# Patient Record
Sex: Female | Born: 2000 | Race: Black or African American | Hispanic: No | Smoking: Never smoker
Health system: Southern US, Community
[De-identification: ages and names within clinical notes are randomized; demographics above are authoritative.]

## PROBLEM LIST (undated history)

## (undated) NOTE — *Deleted (*Deleted)
PROGRESS NOTE    Pamela Bryant  BJS:[id number] DOB: [birth year] DOA: 09/14/2020 PCP: Pcp, No    Chief Complaint  Patient presents with  . Loss of Consciousness  . Laceration    Brief Narrative:  HPI per Dr Ponciano Ort is a 33 y.o. female with no previous past medical history who is coming to the emergency department after passing out at home.  Per patient, after she had waking up earlier, she took a shower around 1100 and felt a little lightheaded just before coming out of the shower.  When she went to her room, she felt like her vision was blacking out along with a fainting sensation, passed out and do not remember what else happened after the.  She hit herself on her chin and fractured 2 oh her premolars on her right side.  She initially had a headache, but this has since then disappeared.  She denies any other prodromal symptoms like dyspnea, chest pain/pressure, palpitations, diaphoresis, nausea or emesis.  She mentioned that she last ate (Chipotle) the previous night around 2200.  She does not have a history of previous hypoglycemic or syncopal episodes.  She denies fever, chills, rhinorrhea, sore throat, wheezing or hemoptysis.  No abdominal pain, diarrhea, constipation, melena or hematochezia.  She denies dysuria, frequency or hematuria.  She denies polyuria, polydipsia, polyphagia or blurred vision.  ED Course: Initial vital signs were temperature 98.8 F, pulse 92, respiration 15, blood pressure 115/85 mmHg O2 sat 100% on room air.   Her urinalysis was hazy with moderate hemoglobinuria, ketonuria of 20 and proteinuria of 38 mg/dL.  Nitrites negative.  Leukocyte esterase is large with more than 50 WBC with 11-20 RBC per hpf.  However, bacteria are absent.  CBC and BMP were normal.  Serum quantitative hCG was negative.  Several measurements of capillary blood glucose have been hypoglycemic.   Assessment & Plan:   Principal Problem:   Syncopal episode Active Problems:    Hypoglycemia   Dental injury   Facial laceration   APC (atrial premature contractions)   Abnormal urinalysis   Syncope   1 syncope Questionable etiology. Differential includes cardiogenic etiology versus vasovagal versus orthostasis. Per admitting physician patient noted to have experienced some lightheadedness after getting out of the shower and subsequently syncopal episode. Patient denies any tongue biting or bowel or bladder incontinence. Orthostatics were not checked on admission and were checked after patient had received boluses of IV fluids and was subsequently negative. Patient however noted to have systolic blood pressure of 89 per ED records. Patient with no other prodrome symptoms. Head CT negative. 2D echo done this morning with a EF of 55 to 60%, no wall motion abnormalities, abnormal mitral valve, mild leaflet thickening without prolapse, trivial mitral valve regurgitation.  EKG with no ischemic changes noted. Patient noted to have a CBG as low as 54 however doubt that was the etiology of patient's syncopal episode. Hemoglobin A1c pending. Cardiology consulted and have assessed the patient. Placed on IV fluids of D5 normal saline at 125 cc an hour for the next 12 to 24 hours. Monitor CBGs. Monitor on telemetry. Supportive care. Follow.  2. Hypoglycemia Questionable etiology. Placed on D5 normal saline 125 cc an hour for the next 12 to 24 hours. Follow CBGs every 4 hours. If blood glucose levels remained improved will discontinue D5 normal saline and monitor CBGs. Hemoglobin A1c ordered and pending. Follow.  3. Dental injury Outpatient follow-up with dentist.  4. Facial laceration Continue local wound  care.  5. Abnormal urinalysis Urinalysis with large leukocytes, nitrite negative, greater than 50 WBCs. Urine cultures pending. Placed empirically on IV Rocephin. Supportive care. Follow.   DVT prophylaxis: Lovenox. Code Status: Full Family Communication: Updated patient.  No  family at bedside. Disposition:   Status is: Inpatient    Dispo: The patient is from: Home              Anticipated d/c is to: Home              Anticipated d/c date is: 1 to 2 days.              Patient currently being evaluated for syncopal episode, being monitored on telemetry.  Not stable for discharge.       Consultants:   Cardiology: Dr. Shari Prows 09/15/2020  Procedures:   2D echo 09/15/2020  Plain films of the left shoulder 09/14/2020  CT head 09/14/2020  Antimicrobials:   IV Rocephin 09/15/2020   Subjective: In ED, getting orthostatics done.  Denies chest pain.  No shortness of breath.  Objective: Vitals:   09/15/20 1610 09/15/20 2011 09/16/20 0016 09/16/20 0410  BP: 111/67 118/85 117/86 109/62  Pulse: 94 74 73 (!) 48  Resp:  17 20 16   Temp: 99.4 F (37.4 C) 99 F (37.2 C) 99.1 F (37.3 C) 97.6 F (36.4 C)  TempSrc: Oral Oral Oral Oral  SpO2: 100% 100% 100% 100%  Weight:      Height:        Intake/Output Summary (Last 24 hours) at 09/16/2020 1239 Last data filed at 09/16/2020 1100 Gross per 24 hour  Intake 1075.65 ml  Output -  Net 1075.65 ml   Filed Weights   09/14/20 1242  Weight: 61.4 kg    Examination:  General exam: Appears calm and comfortable  Respiratory system: Clear to auscultation. Respiratory effort normal. Cardiovascular system: Regular rate rhythm with some extrasystoles noted.  No JVD.  No murmurs rubs or gallops.  No lower extremity edema.  Gastrointestinal system: Abdomen is nondistended, soft and nontender. No organomegaly or masses felt. Normal bowel sounds heard. Central nervous system: Alert and oriented. No focal neurological deficits. Extremities: Symmetric 5 x 5 power. Skin: Left chin area with laceration.  Psychiatry: Judgement and insight appear normal. Mood & affect appropriate.     Data Reviewed: I have personally reviewed following labs and imaging studies  CBC: Recent Labs  Lab 09/14/20 1414  WBC  8.1  HGB 12.3  HCT 38.1  MCV 83.9  PLT 235    Basic Metabolic Panel: Recent Labs  Lab 09/14/20 1414 09/15/20 0458 09/16/20 0458  NA 141  --  139  K 3.9  --  3.3*  CL 103  --  107  CO2 27  --  25  GLUCOSE 87  --  108*  BUN 12  --  9  CREATININE 0.70  --  0.75  CALCIUM 9.7  --  8.3*  MG  --  1.8 2.1  PHOS  --  4.1  --     GFR: Estimated Creatinine Clearance: 93.6 mL/min (by C-G formula based on SCr of 0.75 mg/dL).  Liver Function Tests: Recent Labs  Lab 09/15/20 0458  AST 19  ALT 15  ALKPHOS 30*  BILITOT 0.5  PROT 6.6  ALBUMIN 3.7    CBG: Recent Labs  Lab 09/16/20 0006 09/16/20 0408 09/16/20 0747 09/16/20 1135 09/16/20 1204  GLUCAP 84 92 76 61* 65*     Recent Results (from  the past 240 hour(s))  Urine Culture     Status: None (Preliminary result)   Collection Time: 09/14/20  7:36 PM   Specimen: Urine, Clean Catch  Result Value Ref Range Status   Specimen Description   Final    URINE, CLEAN CATCH Performed at [facility], [address] [place], [place] [address]    Special Requests   Final    Normal Performed at [facility], [address], [place], [place] [address]    Culture   Final    CULTURE REINCUBATED FOR BETTER GROWTH Performed at [facility], [address], [place], [place] [address]    Report Status PENDING  Incomplete  Resp Panel by RT PCR (RSV, Flu A&B, Covid) - Nasopharyngeal Swab     Status: None   Collection Time: 09/15/20 10:10 AM   Specimen: Nasopharyngeal Swab  Result Value Ref Range Status   SARS Coronavirus 2 by RT PCR NEGATIVE NEGATIVE Final    Comment: (NOTE) SARS-CoV-2 target nucleic acids are NOT DETECTED.  The SARS-CoV-2 RNA is generally detectable in upper respiratoy specimens during the acute phase of infection. The lowest concentration of SARS-CoV-2 viral copies this assay can detect is 131 copies/mL. A negative result does not preclude SARS-Cov-2 infection and  should not be used as the sole basis for treatment or other patient management decisions. A negative result may occur with  improper specimen collection/handling, submission of specimen other than nasopharyngeal swab, presence of viral mutation(s) within the areas targeted by this assay, and inadequate number of viral copies (<131 copies/mL). A negative result must be combined with clinical observations, patient history, and epidemiological information. The expected result is Negative.  Fact Sheet for Patients:  https://www.moore.com/  Fact Sheet for Healthcare Providers:  https://www.young.biz/  This test is no t yet approved or cleared by the [place] FDA and  has been authorized for detection and/or diagnosis of SARS-CoV-2 by FDA under an Emergency Use Authorization (EUA). This EUA will remain  in effect (meaning this test can be used) for the duration of the COVID-19 declaration under Section 564(b)(1) of the Act, 21 U.S.C. section 360bbb-3(b)(1), unless the authorization is terminated or revoked sooner.     Influenza A by PCR NEGATIVE NEGATIVE Final   Influenza B by PCR NEGATIVE NEGATIVE Final    Comment: (NOTE) The Xpert Xpress SARS-CoV-2/FLU/RSV assay is intended as an aid in  the diagnosis of influenza from Nasopharyngeal swab specimens and  should not be used as a sole basis for treatment. Nasal washings and  aspirates are unacceptable for Xpert Xpress SARS-CoV-2/FLU/RSV  testing.  Fact Sheet for Patients: https://www.moore.com/  Fact Sheet for Healthcare Providers: https://www.young.biz/  This test is not yet approved or cleared by the [place] FDA and  has been authorized for detection and/or diagnosis of SARS-CoV-2 by  FDA under an Emergency Use Authorization (EUA). This EUA will remain  in effect (meaning this test can be used) for the duration of the  Covid-19 declaration  under Section 564(b)(1) of the Act, 21  U.S.C. section 360bbb-3(b)(1), unless the authorization is  terminated or revoked.    Respiratory Syncytial Virus by PCR NEGATIVE NEGATIVE Final    Comment: (NOTE) Fact Sheet for Patients: https://www.moore.com/  Fact Sheet for Healthcare Providers: https://www.young.biz/  This test is not yet approved or cleared by the [place] FDA and  has been authorized for detection and/or diagnosis of SARS-CoV-2 by  FDA under an Emergency Use Authorization (EUA).  This EUA will remain  in effect (meaning this test can be used) for the duration of the  COVID-19 declaration under Section 564(b)(1) of the Act, 21 U.S.C.  section 360bbb-3(b)(1), unless the authorization is terminated or  revoked. Performed at [facility], [address], [place], [place] [address]          Radiology Studies: CT Head Wo Contrast  Result Date: 09/14/2020 CLINICAL DATA:  Syncope EXAM: CT HEAD WITHOUT CONTRAST TECHNIQUE: Contiguous axial images were obtained from the base of the skull through the vertex without intravenous contrast. COMPARISON:  None. FINDINGS: Brain: No evidence of acute infarction, hemorrhage, hydrocephalus, extra-axial collection or mass lesion/mass effect. Vascular: No hyperdense vessel or unexpected calcification. Skull: Normal. Negative for fracture or focal lesion. Sinuses/Orbits: No acute finding. Other: None. IMPRESSION: No acute intracranial pathology. Electronically Signed   By: Lauralyn Primes M.D.   On: 09/14/2020 15:26   DG Shoulder Left  Result Date: 09/14/2020 CLINICAL DATA:  Pain after fall. EXAM: LEFT SHOULDER - 2+ VIEW COMPARISON:  None. FINDINGS: There is no evidence of fracture or dislocation. There is no evidence of arthropathy or other focal bone abnormality. Soft tissues are unremarkable. IMPRESSION: Negative. Electronically Signed   By: Gerome Sam III M.D   On: 09/14/2020  15:15   ECHOCARDIOGRAM COMPLETE  Result Date: 09/15/2020    ECHOCARDIOGRAM REPORT   Patient Name:   Pamela Bryant Date of Exam: 09/15/2020 Medical Rec #:  [id number]     Height:       63.0 in Accession #:    [id number]    Weight:       135.4 lb Date of Birth:  06-05-2001    BSA:          1.638 m Patient Age:    18 years      BP:           97/61 mmHg Patient Gender: F             HR:           64 bpm. Exam Location:  Inpatient Procedure: 2D Echo, Cardiac Doppler and Color Doppler Indications:    R55 Syncope  History:        Patient has no prior history of Echocardiogram examinations.  Sonographer:    Elmarie Shiley Dance Referring Phys: [id number] DAVID MANUEL ORTIZ IMPRESSIONS  1. Left ventricular ejection fraction, by estimation, is 55 to 60%. The left ventricle has normal function. The left ventricle has no regional wall motion abnormalities. Left ventricular diastolic parameters were normal.  2. Right ventricular systolic function is normal. The right ventricular size is normal.  3. The mitral valve is abnormal. Mild leaflet thickening without prolapse. Trivial mitral valve regurgitation.  4. The aortic valve is tricuspid. Aortic valve regurgitation is not visualized. Conclusion(s)/Recommendation(s): Otherwise normal echocardiogram, with minor abnormalities described in the report. FINDINGS  Left Ventricle: Left ventricular ejection fraction, by estimation, is 55 to 60%. The left ventricle has normal function. The left ventricle has no regional wall motion abnormalities. The left ventricular internal cavity size was normal in size. There is  no left ventricular hypertrophy. Left ventricular diastolic parameters were normal. Right Ventricle: The right ventricular size is normal. No increase in right ventricular wall thickness. Right ventricular systolic function is normal. Left Atrium: Left atrial size was normal in size. Right Atrium: Right atrial size was normal in size. Pericardium: There is no evidence of  pericardial effusion. Mitral Valve: The mitral valve is abnormal.  There is mild thickening of the mitral valve leaflet(s). Trivial mitral valve regurgitation. Tricuspid Valve: The tricuspid valve is grossly normal. Tricuspid valve regurgitation is trivial. Aortic Valve: The aortic valve is tricuspid. Aortic valve regurgitation is not visualized. Pulmonic Valve: The pulmonic valve was grossly normal. Pulmonic valve regurgitation is trivial. Aorta: The aortic root and ascending aorta are structurally normal, with no evidence of dilitation. IAS/Shunts: No atrial level shunt detected by color flow Doppler.  LEFT VENTRICLE PLAX 2D LVIDd:         3.60 cm LVIDs:         2.90 cm LV PW:         0.90 cm LV IVS:        0.60 cm LVOT diam:     2.00 cm LV SV:         47 LV SV Index:   29 LVOT Area:     3.14 cm  RIGHT VENTRICLE             IVC RV Basal diam:  2.50 cm     IVC diam: 1.30 cm RV S prime:     10.40 cm/s TAPSE (M-mode): 1.7 cm LEFT ATRIUM             Index       RIGHT ATRIUM           Index LA diam:        2.90 cm 1.77 cm/m  RA Area:     10.40 cm LA Vol (A2C):   58.2 ml 35.53 ml/m RA Volume:   20.20 ml  12.33 ml/m LA Vol (A4C):   33.9 ml 20.69 ml/m LA Biplane Vol: 45.1 ml 27.53 ml/m  AORTIC VALVE LVOT Vmax:   80.10 cm/s LVOT Vmean:  49.000 cm/s LVOT VTI:    0.151 m  AORTA Ao Root diam: 2.80 cm Ao Asc diam:  2.40 cm MITRAL VALVE MV Area (PHT): 3.60 cm    SHUNTS MV Decel Time: 211 msec    Systemic VTI:  0.15 m MV E velocity: 70.30 cm/s  Systemic Diam: 2.00 cm MV A velocity: 37.70 cm/s MV E/A ratio:  1.86 Zoila Shutter MD Electronically signed by Zoila Shutter MD Signature Date/Time: 09/15/2020/1:16:41 PM    Final         Scheduled Meds: . enoxaparin (LOVENOX) injection  30 mg Subcutaneous Q24H  . influenza vac split quadrivalent PF  0.5 mL Intramuscular Tomorrow-1000   Continuous Infusions: . cefTRIAXone (ROCEPHIN)  IV 1 g (09/16/20 1229)     LOS: 1 day    Time spent: 35 minutes    Ramiro Harvest, MD [facility]   To contact the attending provider between 7A-7P or the covering provider during after hours 7P-7A, please log into the web site [email or web address] and access using universal [place] password for that web site. If you do not have the password, please call the hospital operator.  09/16/2020, 12:39 PM

## (undated) NOTE — *Deleted (*Deleted)
Hypoglycemic Event  CBG: 61  Treatment: 8 oz juice/soda  Symptoms: None  Follow-up CBG: Time:*** CBG Result:***  Possible Reasons for Event: Inadequate meal intake and Unknown  Comments/MD notified:***    Pamela Bryant, Pamela Bryant

---

## 2020-09-14 ENCOUNTER — Emergency Department (HOSPITAL_COMMUNITY): Payer: Medicaid Other

## 2020-09-14 ENCOUNTER — Other Ambulatory Visit: Payer: Self-pay

## 2020-09-14 ENCOUNTER — Encounter (HOSPITAL_COMMUNITY): Payer: Self-pay

## 2020-09-14 ENCOUNTER — Inpatient Hospital Stay (HOSPITAL_COMMUNITY)
Admission: EM | Admit: 2020-09-14 | Discharge: 2020-09-16 | DRG: 641 | Disposition: A | Discharge status: Home / Self Care | Payer: Medicaid Other | Attending: Internal Medicine | Admitting: Internal Medicine

## 2020-09-14 DIAGNOSIS — R8290 Unspecified abnormal findings in urine: Secondary | ICD-10-CM | POA: Diagnosis present

## 2020-09-14 DIAGNOSIS — R809 Proteinuria, unspecified: Secondary | ICD-10-CM | POA: Diagnosis present

## 2020-09-14 DIAGNOSIS — S025XXA Fracture of tooth (traumatic), initial encounter for closed fracture: Secondary | ICD-10-CM | POA: Diagnosis present

## 2020-09-14 DIAGNOSIS — S0181XA Laceration without foreign body of other part of head, initial encounter: Secondary | ICD-10-CM

## 2020-09-14 DIAGNOSIS — S0993XA Unspecified injury of face, initial encounter: Secondary | ICD-10-CM | POA: Diagnosis present

## 2020-09-14 DIAGNOSIS — R823 Hemoglobinuria: Secondary | ICD-10-CM | POA: Diagnosis present

## 2020-09-14 DIAGNOSIS — Y92002 Bathroom of unspecified non-institutional (private) residence single-family (private) house as the place of occurrence of the external cause: Secondary | ICD-10-CM

## 2020-09-14 DIAGNOSIS — R55 Syncope and collapse: Principal | ICD-10-CM

## 2020-09-14 DIAGNOSIS — Y93E1 Activity, personal bathing and showering: Secondary | ICD-10-CM

## 2020-09-14 DIAGNOSIS — I491 Atrial premature depolarization: Secondary | ICD-10-CM | POA: Diagnosis present

## 2020-09-14 DIAGNOSIS — N390 Urinary tract infection, site not specified: Secondary | ICD-10-CM

## 2020-09-14 DIAGNOSIS — E876 Hypokalemia: Secondary | ICD-10-CM | POA: Diagnosis not present

## 2020-09-14 DIAGNOSIS — W1839XA Other fall on same level, initial encounter: Secondary | ICD-10-CM | POA: Diagnosis present

## 2020-09-14 DIAGNOSIS — R824 Acetonuria: Secondary | ICD-10-CM | POA: Diagnosis present

## 2020-09-14 DIAGNOSIS — Z20822 Contact with and (suspected) exposure to covid-19: Secondary | ICD-10-CM | POA: Diagnosis present

## 2020-09-14 DIAGNOSIS — E162 Hypoglycemia, unspecified: Principal | ICD-10-CM | POA: Diagnosis present

## 2020-09-14 LAB — BASIC METABOLIC PANEL
Anion gap: 11 (ref 5–15)
BUN: 12 mg/dL (ref 6–20)
CO2: 27 mmol/L (ref 22–32)
Calcium: 9.7 mg/dL (ref 8.9–10.3)
Chloride: 103 mmol/L (ref 98–111)
Creatinine, Ser: 0.7 mg/dL (ref 0.44–1.00)
GFR, Estimated: >60 mL/min (ref >60)
Glucose, Bld: 87 mg/dL (ref 70–99)
Potassium: 3.9 mmol/L (ref 3.5–5.1)
Sodium: 141 mmol/L (ref 135–145)

## 2020-09-14 LAB — CBG MONITORING, ED
Glucose-Capillary: 54 mg/dL — ABNORMAL LOW (ref 70–99)
Glucose-Capillary: 64 mg/dL — ABNORMAL LOW (ref 70–99)
Glucose-Capillary: 57 mg/dL — ABNORMAL LOW (ref 70–99)
Glucose-Capillary: 61 mg/dL — ABNORMAL LOW (ref 70–99)
Glucose-Capillary: 205 mg/dL — ABNORMAL HIGH (ref 70–99)

## 2020-09-14 LAB — URINALYSIS, ROUTINE W REFLEX MICROSCOPIC
Bacteria, UA: NONE SEEN
Bilirubin Urine: NEGATIVE
Glucose, UA: NEGATIVE mg/dL
Ketones, ur: 20 mg/dL — AB
Nitrite: NEGATIVE
Protein, ur: 30 mg/dL — AB
Specific Gravity, Urine: 1.018 (ref 1.005–1.030)
WBC, UA: >50 WBC/hpf — ABNORMAL HIGH (ref 0–5)
pH: 6 (ref 5.0–8.0)

## 2020-09-14 LAB — CBC
HCT: 38.1 % (ref 36.0–46.0)
Hemoglobin: 12.3 g/dL (ref 12.0–15.0)
MCH: 27.1 pg (ref 26.0–34.0)
MCHC: 32.3 g/dL (ref 30.0–36.0)
MCV: 83.9 fL (ref 80.0–100.0)
Platelets: 235 10*3/uL (ref 150–400)
RBC: 4.54 MIL/uL (ref 3.87–5.11)
RDW: 13.9 % (ref 11.5–15.5)
WBC: 8.1 10*3/uL (ref 4.0–10.5)
nRBC: 0 % (ref 0.0–0.2)

## 2020-09-14 LAB — I-STAT BETA HCG BLOOD, ED (MC, WL, AP ONLY): I-stat hCG, quantitative: <5 m[IU]/mL (ref <5)

## 2020-09-14 MED ORDER — KCL IN DEXTROSE-NACL 20-5-0.9 MEQ/L-%-% IV SOLN
Freq: Once | INTRAVENOUS | Status: DC
  Filled 2020-09-14: qty 1000

## 2020-09-14 MED ORDER — KCL IN DEXTROSE-NACL 20-5-0.9 MEQ/L-%-% IV SOLN
INTRAVENOUS | Status: DC
  Filled 2020-09-14 (×2): qty 1000

## 2020-09-14 MED ORDER — OXYCODONE HCL 5 MG PO TABS: 5.0000 mg | ORAL_TABLET | ORAL | Status: DC | PRN

## 2020-09-14 MED ORDER — DEXTROSE 10 % IV SOLN: INTRAVENOUS | Status: DC

## 2020-09-14 MED ORDER — KETOROLAC TROMETHAMINE 30 MG/ML IJ SOLN: 30.0000 mg | Freq: Four times a day (QID) | INTRAMUSCULAR | Status: AC | PRN

## 2020-09-14 MED ORDER — ACETAMINOPHEN 325 MG PO TABS: 650.0000 mg | ORAL_TABLET | Freq: Four times a day (QID) | ORAL | Status: DC | PRN

## 2020-09-14 MED ORDER — DEXTROSE 50 % IV SOLN
25.0000 g | Freq: Once | INTRAVENOUS | Status: AC
  Administered 2020-09-14: 25 g via INTRAVENOUS
  Filled 2020-09-14: qty 50

## 2020-09-14 MED ORDER — ONDANSETRON HCL 4 MG/2ML IJ SOLN: 4.0000 mg | Freq: Four times a day (QID) | INTRAMUSCULAR | Status: DC | PRN

## 2020-09-14 MED ORDER — ACETAMINOPHEN 650 MG RE SUPP: 650.0000 mg | Freq: Four times a day (QID) | RECTAL | Status: DC | PRN

## 2020-09-14 MED ORDER — KCL IN DEXTROSE-NACL 20-5-0.9 MEQ/L-%-% IV SOLN
INTRAVENOUS | Status: AC
  Filled 2020-09-14: qty 1000

## 2020-09-14 MED ORDER — CEPHALEXIN 500 MG PO CAPS: 500.0000 mg | ORAL_CAPSULE | Freq: Four times a day (QID) | ORAL | 0 refills | Status: DC

## 2020-09-14 MED ORDER — DEXTROSE 50 % IV SOLN: 12.5000 g | INTRAVENOUS | Status: DC | PRN

## 2020-09-14 MED ORDER — ONDANSETRON HCL 4 MG PO TABS: 4.0000 mg | ORAL_TABLET | Freq: Four times a day (QID) | ORAL | Status: DC | PRN

## 2020-09-14 NOTE — Discharge Instructions (Signed)
Return if any problems.

## 2020-09-14 NOTE — H&P (Signed)
History and Physical    Pamela Bryant KDT:[id number] DOB: 30-Apr-2001 DOA: 09/14/2020  PCP: Pcp, No   Patient coming from: Home.   I have personally briefly reviewed patient's old medical records in [facility]  Chief Complaint:  "Passed out".  HPI: Pamela Bryant is a 19 y.o. female with no previous past medical history who is coming to the emergency department after passing out at home.  Per patient, after she had waking up earlier, she took a shower around 1100 and felt a little lightheaded just before coming out of the shower.  When she went to her room, she felt like her vision was blacking out along with a fainting sensation, passed out and do not remember what else happened after the.  She hit herself on her chin and fractured 2 oh her premolars on her right side.  She initially had a headache, but this has since then disappeared.  She denies any other prodromal symptoms like dyspnea, chest pain/pressure, palpitations, diaphoresis, nausea or emesis.  She mentioned that she last ate (Chipotle) the previous night around 2200.  She does not have a history of previous hypoglycemic or syncopal episodes.  She denies fever, chills, rhinorrhea, sore throat, wheezing or hemoptysis.  No abdominal pain, diarrhea, constipation, melena or hematochezia.  She denies dysuria, frequency or hematuria.  She denies polyuria, polydipsia, polyphagia or blurred vision.  ED Course: Initial vital signs were temperature 98.8 F, pulse 92, respiration 15, blood pressure 115/85 mmHg O2 sat 100% on room air.   Her urinalysis was hazy with moderate hemoglobinuria, ketonuria of 20 and proteinuria of 38 mg/dL.  Nitrites negative.  Leukocyte esterase is large with more than 50 WBC with 11-20 RBC per hpf.  However, bacteria are absent.  CBC and BMP were normal.  Serum quantitative hCG was negative.  Several measurements of capillary blood glucose have been hypoglycemic.  Review of Systems: As per HPI otherwise all other  systems reviewed and are negative.  History reviewed. No pertinent past medical history.  History reviewed. No pertinent surgical history.  Social History  reports that she has never smoked. She has never used smokeless tobacco. She reports that she does not drink alcohol and does not use drugs.  No Known Allergies  Family History  Problem Relation Age of Onset  . Healthy Mother   . Healthy Father   No known medical history on her close relatives and extended family members.   Prior to Admission medications   Medication Sig Start Date End Date Taking? Authorizing Provider  cephALEXin (KEFLEX) 500 MG capsule Take 1 capsule (500 mg total) by mouth 4 (four) times daily for 7 days. 09/14/20 09/21/20  Elson Areas, PA-C   Physical Exam: Vitals:   09/14/20 1930 09/14/20 2000 09/14/20 2030 09/14/20 2100  BP: 106/71 115/74 105/68 121/89  Pulse: 87 79 88 73  Resp:    20  Temp:      TempSrc:      SpO2: 100% 99% 100% 99%  Weight:      Height:       Constitutional: NAD, calm, comfortable Eyes: PERRL, lids and conjunctivae mildly injected. ENMT: Mucous membranes are moist.  Fractured upper and lower premolars.  Posterior pharynx clear of any exudate or lesions. Neck: normal, supple, no masses, no thyromegaly Respiratory: clear to auscultation bilaterally, no wheezing, no crackles. Normal respiratory effort. No accessory muscle use.  Cardiovascular: Frequent extrasystoles, no murmurs / rubs / gallops. No extremity edema. 2+ pedal pulses. No carotid  bruits.  Abdomen: no tenderness, no masses palpated. No hepatosplenomegaly. Bowel sounds positive.  Musculoskeletal: no clubbing / cyanosis. Good ROM, no contractures. Normal muscle tone.  Skin: Left chin area laceration. Neurologic: CN 2-12 grossly intact. Sensation intact, DTR normal. Strength 5/5 in all 4.  Psychiatric: Normal judgment and insight. Alert and oriented x 3. Normal mood.   Labs on Admission: I have personally reviewed  following labs and imaging studies  CBC: Recent Labs  Lab 09/14/20 1414  WBC 8.1  HGB 12.3  HCT 38.1  MCV 83.9  PLT 235    Basic Metabolic Panel: Recent Labs  Lab 09/14/20 1414  NA 141  K 3.9  CL 103  CO2 27  GLUCOSE 87  BUN 12  CREATININE 0.70  CALCIUM 9.7   GFR: Estimated Creatinine Clearance: 93.6 mL/min (by C-G formula based on SCr of 0.7 mg/dL).  Liver Function Tests: No results for input(s): AST, ALT, ALKPHOS, BILITOT, PROT, ALBUMIN in the last 168 hours.  Urine analysis:    Component Value Date/Time   COLORURINE YELLOW 09/14/2020 1735   APPEARANCEUR HAZY (A) 09/14/2020 1735   LABSPEC 1.018 09/14/2020 1735   PHURINE 6.0 09/14/2020 1735   GLUCOSEU NEGATIVE 09/14/2020 1735   HGBUR MODERATE (A) 09/14/2020 1735   BILIRUBINUR NEGATIVE 09/14/2020 1735   KETONESUR 20 (A) 09/14/2020 1735   PROTEINUR 30 (A) 09/14/2020 1735   NITRITE NEGATIVE 09/14/2020 1735   LEUKOCYTESUR LARGE (A) 09/14/2020 1735   Radiological Exams on Admission: CT Head Wo Contrast  Result Date: 09/14/2020 CLINICAL DATA:  Syncope EXAM: CT HEAD WITHOUT CONTRAST TECHNIQUE: Contiguous axial images were obtained from the base of the skull through the vertex without intravenous contrast. COMPARISON:  None. FINDINGS: Brain: No evidence of acute infarction, hemorrhage, hydrocephalus, extra-axial collection or mass lesion/mass effect. Vascular: No hyperdense vessel or unexpected calcification. Skull: Normal. Negative for fracture or focal lesion. Sinuses/Orbits: No acute finding. Other: None. IMPRESSION: No acute intracranial pathology. Electronically Signed   By: Lauralyn Primes M.D.   On: 09/14/2020 15:26   DG Shoulder Left  Result Date: 09/14/2020 CLINICAL DATA:  Pain after fall. EXAM: LEFT SHOULDER - 2+ VIEW COMPARISON:  None. FINDINGS: There is no evidence of fracture or dislocation. There is no evidence of arthropathy or other focal bone abnormality. Soft tissues are unremarkable. IMPRESSION:  Negative. Electronically Signed   By: Gerome Sam III M.D   On: 09/14/2020 15:15   EKG: Independently reviewed.  Vent. rate 95 BPM PR interval 126 ms QRS duration 90 ms QT/QTc 332/417 ms P-R-T axes 90 69 39 Normal sinus rhythm with sinus arrhythmia Nonspecific T wave abnormality  Assessment/Plan Principal Problem:   Syncopal episode Observation/telemetry. Continue IV fluids. Continue hyperglycemia treatment. Recheck EKG in a.m. Check echocardiogram in a.m.  Active Problems:   Hypoglycemia Encourage oral intake. Continue dextrose infusion. Monitor glucose every 4 hours.    Dental injury Follow-up with orthodontist.    Facial laceration Continue local care.    Asymptomatic Abnormal urinalysis No need for treatment. Patient advised to increase hydration.    DVT prophylaxis: SCDs.  Code Status:   Full code. Family Communication: Disposition Plan:   Patient is from:  Home.  Anticipated DC to:  Home.  Anticipated DC date:  09/15/2020.  Anticipated DC barriers: Clinical status and echocardiogram.  Consults called: Admission status:  Observation/telemetry.  Severity of Illness:  Bobette Mo MD [facility]  How to contact the [facility] Attending or Consulting provider 7A - 7P or covering provider during  after hours 7P -7A, for this patient?   1. Check the care team in [facility] and look for a) attending/consulting [facility] provider listed and b) the [facility] team listed 2. Log into [email or web address] and use [place] universal password to access. If you do not have the password, please contact the hospital operator. 3. Locate the [facility] provider you are looking for under [facility] and page to a number that you can be directly reached. 4. If you still have difficulty reaching the provider, please page the [facility] (Director on Call) for the Hospitalists listed on amion for assistance.  09/14/2020, 9:13 PM

## 2020-09-14 NOTE — ED Notes (Signed)
Ginger ale given per her request. She remains in no distress.

## 2020-09-14 NOTE — ED Triage Notes (Signed)
Patient states she was taking a shower less than an hour ago and passed out. Patient states she hit her chin on the floor an has a laceration. Bleeding controlled. Patient denies any blood thinners.

## 2020-09-14 NOTE — ED Notes (Signed)
Pt resting at this time. Unable to obtain urine at this time. Pt friend at bedside stated "she just dozed off." Pt friend stated she would "let me know when the pt wakes" to obtain urine.

## 2020-09-14 NOTE — ED Provider Notes (Signed)
[facility]-EMERGENCY DEPT Provider Note   CSN: [id number] Arrival date & time: 09/14/20  1217     History Chief Complaint  Patient presents with  . Loss of Consciousness  . Laceration    Pamela Bryant is a 19 y.o. female.  The history is provided by the patient. No language interpreter was used.  Loss of Consciousness Episode history:  Single Most recent episode:  Today Duration:  1 minute Timing:  Constant Progression:  Resolved Witnessed: no   Relieved by:  Nothing Worsened by:  Nothing Ineffective treatments:  None tried Associated symptoms: no nausea, no palpitations and no seizures   Risk factors: no seizures   Laceration Pt reports she passed out and hit her mouth.  Pt complains of a cut to her chin, broken teeth and pain in her shoulder.  Pt denies any medical problems.  Pt was feeling well before episode.  Pt had just taken a shower.       History reviewed. No pertinent past medical history.  There are no problems to display for this patient.   History reviewed. No pertinent surgical history.   OB History   No obstetric history on file.     Family History  Problem Relation Age of Onset  . Healthy Mother   . Healthy Father     Social History   Tobacco Use  . Smoking status: Never Smoker  . Smokeless tobacco: Never Used  Vaping Use  . Vaping Use: Never used  Substance Use Topics  . Alcohol use: Never  . Drug use: Never    Home Medications Prior to Admission medications   Medication Sig Start Date End Date Taking? Authorizing Provider  cephALEXin (KEFLEX) 500 MG capsule Take 1 capsule (500 mg total) by mouth 4 (four) times daily for 7 days. 09/14/20 09/21/20  Elson Areas, PA-C    Allergies    Patient has no known allergies.  Review of Systems   Review of Systems  Cardiovascular: Positive for syncope. Negative for palpitations.  Gastrointestinal: Negative for nausea.  Neurological: Negative for seizures.  All  other systems reviewed and are negative.   Physical Exam Updated Vital Signs BP 124/80   Pulse 92   Temp 98.4 F (36.9 C) (Oral)   Resp 18   Ht 5\' 3"  (1.6 m)   Wt 61.4 kg   LMP 09/13/2020   SpO2 100%   BMI 23.99 kg/m   Physical Exam Vitals and nursing note reviewed.  Constitutional:      Appearance: She is well-developed.  HENT:     Head: Normocephalic.     Comments: 1.0 cm laceration chin     Nose: Nose normal.     Mouth/Throat:     Mouth: Mucous membranes are moist.     Comments: Broken right bicuspid  Cardiovascular:     Rate and Rhythm: Normal rate.  Pulmonary:     Effort: Pulmonary effort is normal.  Abdominal:     General: Abdomen is flat. There is no distension.  Musculoskeletal:        General: Normal range of motion.     Cervical back: Normal range of motion.     Comments: Tender left shoulder, pain with range of motion nv and and ns intact   Skin:    General: Skin is warm.  Neurological:     General: No focal deficit present.     Mental Status: She is alert and oriented to person, place, and time.  Psychiatric:  Mood and Affect: Mood normal.     ED Results / Procedures / Treatments   Labs (all labs ordered are listed, but only abnormal results are displayed) Labs Reviewed  URINALYSIS, ROUTINE W REFLEX MICROSCOPIC - Abnormal; Notable for the following components:      Result Value   APPearance HAZY (*)    Hgb urine dipstick MODERATE (*)    Ketones, ur 20 (*)    Protein, ur 30 (*)    Leukocytes,Ua LARGE (*)    WBC, UA >50 (*)    Non Squamous Epithelial 0-5 (*)    All other components within normal limits  CBG MONITORING, ED - Abnormal; Notable for the following components:   Glucose-Capillary 54 (*)    All other components within normal limits  CBG MONITORING, ED - Abnormal; Notable for the following components:   Glucose-Capillary 64 (*)    All other components within normal limits  CBG MONITORING, ED - Abnormal; Notable for the  following components:   Glucose-Capillary 61 (*)    All other components within normal limits  CBG MONITORING, ED - Abnormal; Notable for the following components:   Glucose-Capillary 57 (*)    All other components within normal limits  BASIC METABOLIC PANEL  CBC  I-STAT BETA HCG BLOOD, ED ([facility], [facility], [facility] ONLY)    EKG EKG Interpretation  Date/Time:  Saturday September 14 2020 12:31:17 EDT Ventricular Rate:  95 PR Interval:  126 QRS Duration: 90 QT Interval:  332 QTC Calculation: 417 R Axis:   69 Text Interpretation: Normal sinus rhythm with sinus arrhythmia Nonspecific T wave abnormality Confirmed by Cathren Laine ([id number]) on 09/14/2020 1:34:06 PM   Radiology CT Head Wo Contrast  Result Date: 09/14/2020 CLINICAL DATA:  Syncope EXAM: CT HEAD WITHOUT CONTRAST TECHNIQUE: Contiguous axial images were obtained from the base of the skull through the vertex without intravenous contrast. COMPARISON:  None. FINDINGS: Brain: No evidence of acute infarction, hemorrhage, hydrocephalus, extra-axial collection or mass lesion/mass effect. Vascular: No hyperdense vessel or unexpected calcification. Skull: Normal. Negative for fracture or focal lesion. Sinuses/Orbits: No acute finding. Other: None. IMPRESSION: No acute intracranial pathology. Electronically Signed   By: Lauralyn Primes M.D.   On: 09/14/2020 15:26   DG Shoulder Left  Result Date: 09/14/2020 CLINICAL DATA:  Pain after fall. EXAM: LEFT SHOULDER - 2+ VIEW COMPARISON:  None. FINDINGS: There is no evidence of fracture or dislocation. There is no evidence of arthropathy or other focal bone abnormality. Soft tissues are unremarkable. IMPRESSION: Negative. Electronically Signed   By: Gerome Sam III M.D   On: 09/14/2020 15:15    Procedures .Marland KitchenLaceration Repair  Date/Time: 09/14/2020 7:32 PM Performed by: Elson Areas, PA-C Authorized by: Elson Areas, PA-C   Consent:    Consent obtained:  Verbal   Consent given by:  Patient    Risks discussed:  Infection, need for additional repair, pain, poor cosmetic result and poor wound healing   Alternatives discussed:  No treatment and delayed treatment Universal protocol:    Procedure explained and questions answered to patient or proxy's satisfaction: yes     Relevant documents present and verified: yes     Test results available and properly labeled: yes     Imaging studies available: yes     Required blood products, implants, devices, and special equipment available: yes     Site/side marked: yes     Immediately prior to procedure, a time out was called: yes     Patient identity  confirmed:  Verbally with patient Laceration details:    Location:  Face   Length (cm):  1 Repair type:    Repair type:  Simple Treatment:    Area cleansed with:  Betadine   Irrigation solution:  Sterile saline Skin repair:    Repair method:  Tissue adhesive Approximation:    Approximation:  Close Post-procedure details:    Patient tolerance of procedure:  Tolerated well, no immediate complications   (including critical care time)  Medications Ordered in ED Medications - No data to display  ED Course  I have reviewed the triage vital signs and the nursing notes.  Pertinent labs & imaging results that were available during my care of the patient were reviewed by me and considered in my medical decision making (see chart for details).    MDM Rules/Calculators/A&P                          MDM:  Pt's glucose is low. Pt given juice and crackers.  Pt unable to tolerate solid foods.  Ct head no acute,  Labs normal.  Ua shows greater than 50 wbc's.  I will culture urine.  I will treat with keflex due to dental injury and uti.  I discussed dentist follow up with pt.   Pt's glucose continues to remain low.  I will consult hospitalist for observation  Final Clinical Impression(s) / ED Diagnoses Final diagnoses:  Syncope and collapse  Facial laceration, initial encounter  Dental  injury, initial encounter  Urinary tract infection without hematuria, site unspecified    Rx / DC Orders ED Discharge Orders         Ordered    cephALEXin (KEFLEX) 500 MG capsule  4 times daily        09/14/20 1815           Osie Cheeks 09/14/20 1938    Gwyneth Sprout, MD 09/14/20 1942

## 2020-09-14 NOTE — ED Notes (Signed)
As I write this, pt. Is eating (capably). Will recheck cbg at about 1915 hours. Her parents have arrived, and I (with pt. Permission) have apprised them of her condition.

## 2020-09-14 NOTE — ED Notes (Signed)
She is in no distress, visiting with a friend, who is at bedside.

## 2020-09-15 ENCOUNTER — Observation Stay (HOSPITAL_COMMUNITY): Payer: Medicaid Other

## 2020-09-15 DIAGNOSIS — I491 Atrial premature depolarization: Secondary | ICD-10-CM | POA: Diagnosis present

## 2020-09-15 DIAGNOSIS — S0993XD Unspecified injury of face, subsequent encounter: Secondary | ICD-10-CM | POA: Diagnosis not present

## 2020-09-15 DIAGNOSIS — S0181XD Laceration without foreign body of other part of head, subsequent encounter: Secondary | ICD-10-CM

## 2020-09-15 DIAGNOSIS — R809 Proteinuria, unspecified: Secondary | ICD-10-CM | POA: Diagnosis present

## 2020-09-15 DIAGNOSIS — Y92002 Bathroom of unspecified non-institutional (private) residence single-family (private) house as the place of occurrence of the external cause: Secondary | ICD-10-CM | POA: Diagnosis not present

## 2020-09-15 DIAGNOSIS — W1839XA Other fall on same level, initial encounter: Secondary | ICD-10-CM | POA: Diagnosis present

## 2020-09-15 DIAGNOSIS — N390 Urinary tract infection, site not specified: Secondary | ICD-10-CM | POA: Diagnosis present

## 2020-09-15 DIAGNOSIS — E162 Hypoglycemia, unspecified: Principal | ICD-10-CM

## 2020-09-15 DIAGNOSIS — S025XXA Fracture of tooth (traumatic), initial encounter for closed fracture: Secondary | ICD-10-CM | POA: Diagnosis present

## 2020-09-15 DIAGNOSIS — E876 Hypokalemia: Secondary | ICD-10-CM | POA: Diagnosis not present

## 2020-09-15 DIAGNOSIS — R8290 Unspecified abnormal findings in urine: Secondary | ICD-10-CM

## 2020-09-15 DIAGNOSIS — R55 Syncope and collapse: Secondary | ICD-10-CM | POA: Diagnosis present

## 2020-09-15 DIAGNOSIS — R823 Hemoglobinuria: Secondary | ICD-10-CM | POA: Diagnosis present

## 2020-09-15 DIAGNOSIS — R824 Acetonuria: Secondary | ICD-10-CM | POA: Diagnosis present

## 2020-09-15 DIAGNOSIS — S0181XA Laceration without foreign body of other part of head, initial encounter: Secondary | ICD-10-CM | POA: Diagnosis present

## 2020-09-15 DIAGNOSIS — Z20822 Contact with and (suspected) exposure to covid-19: Secondary | ICD-10-CM | POA: Diagnosis present

## 2020-09-15 DIAGNOSIS — Y93E1 Activity, personal bathing and showering: Secondary | ICD-10-CM | POA: Diagnosis not present

## 2020-09-15 LAB — HEPATIC FUNCTION PANEL
ALT: 15 U/L (ref 0–44)
AST: 19 U/L (ref 15–41)
Albumin: 3.7 g/dL (ref 3.5–5.0)
Alkaline Phosphatase: 30 U/L — ABNORMAL LOW (ref 38–126)
Bilirubin, Direct: 0.1 mg/dL (ref 0.0–0.2)
Indirect Bilirubin: 0.4 mg/dL (ref 0.3–0.9)
Total Bilirubin: 0.5 mg/dL (ref 0.3–1.2)
Total Protein: 6.6 g/dL (ref 6.5–8.1)

## 2020-09-15 LAB — CBG MONITORING, ED
Glucose-Capillary: 105 mg/dL — ABNORMAL HIGH (ref 70–99)
Glucose-Capillary: 109 mg/dL — ABNORMAL HIGH (ref 70–99)
Glucose-Capillary: 79 mg/dL (ref 70–99)
Glucose-Capillary: 85 mg/dL (ref 70–99)

## 2020-09-15 LAB — GLUCOSE, CAPILLARY
Glucose-Capillary: 94 mg/dL (ref 70–99)
Glucose-Capillary: 75 mg/dL (ref 70–99)

## 2020-09-15 LAB — ECHOCARDIOGRAM COMPLETE
Area-P 1/2: 3.6 cm2
Height: 63 in
S' Lateral: 2.9 cm
Weight: 2166.4 oz

## 2020-09-15 LAB — RAPID URINE DRUG SCREEN, HOSP PERFORMED
Amphetamines: NOT DETECTED
Barbiturates: NOT DETECTED
Benzodiazepines: NOT DETECTED
Cocaine: NOT DETECTED
Opiates: NOT DETECTED
Tetrahydrocannabinol: NOT DETECTED

## 2020-09-15 LAB — MAGNESIUM: Magnesium: 1.8 mg/dL (ref 1.7–2.4)

## 2020-09-15 LAB — HIV ANTIBODY (ROUTINE TESTING W REFLEX): HIV Screen 4th Generation wRfx: NONREACTIVE

## 2020-09-15 LAB — RESP PANEL BY RT PCR (RSV, FLU A&B, COVID)
Influenza A by PCR: NEGATIVE
Influenza B by PCR: NEGATIVE
Respiratory Syncytial Virus by PCR: NEGATIVE
SARS Coronavirus 2 by RT PCR: NEGATIVE

## 2020-09-15 LAB — PHOSPHORUS: Phosphorus: 4.1 mg/dL (ref 2.5–4.6)

## 2020-09-15 LAB — D-DIMER, QUANTITATIVE: D-Dimer, Quant: <0.27 ug/mL-FEU (ref 0.00–0.50)

## 2020-09-15 MED ORDER — DEXTROSE-NACL 5-0.9 % IV SOLN: INTRAVENOUS | Status: DC

## 2020-09-15 MED ORDER — SODIUM CHLORIDE 0.9 % IV SOLN
1.0000 g | INTRAVENOUS | Status: DC
  Administered 2020-09-15: 1 g via INTRAVENOUS
  Filled 2020-09-15 (×2): qty 10

## 2020-09-15 MED ORDER — INFLUENZA VAC SPLIT QUAD 0.5 ML IM SUSY
0.5000 mL | PREFILLED_SYRINGE | INTRAMUSCULAR | Status: DC
  Filled 2020-09-15: qty 0.5

## 2020-09-15 MED ORDER — ENOXAPARIN SODIUM 30 MG/0.3ML ~~LOC~~ SOLN
30.0000 mg | SUBCUTANEOUS | Status: DC
  Administered 2020-09-15: 30 mg via SUBCUTANEOUS
  Filled 2020-09-15: qty 0.3

## 2020-09-15 NOTE — ED Notes (Signed)
I have just given report to [name], RN on 4th floor. Will transport asap. Pt. Remains in no distress.

## 2020-09-15 NOTE — Consult Note (Signed)
Cardiology Consultation:   Patient ID: Pamela Bryant MRN: [id number]; DOB: 2001-06-01  Admit date: 09/14/2020 Date of Consult: 09/15/2020  Primary Care Provider: Oneita Hurt No [facility] Cardiologist: No primary care provider on file.  [facility] Electrophysiologist:  None    Patient Profile:   Pamela Bryant is a 19 y.o. female with no significant history who is being seen today for the evaluation of syncope at the request of Dr. Janee Bryant.  History of Present Illness:   Pamela Bryant is a 19 year old healthy female who presented to the ED last night after passing out at home. Cardiology is now consulted for further work-up of her syncope.  The patient states that after taking a shower at 11am, she felt lightheaded and like she was about to pass out. Shortly thereafter, she lost consciousness. She hit her chin and fractured 2 molars on her right side. Denies any other prodromal symptoms like dyspnea, chest pain/pressure, diaphoresis, nausea or palpitations. No recent illnesses, fevers or chills. No abdominal symptoms. Ate normally the night before and had just woken up at that time. Denies alcohol, illicit drug use, herbal supplements.  In the ED, patient with BP 90/50s. HR 60-70s. Glucose was low at 57 and 61. Electrolytes within normal limits. CT head without pathology. ECG with sinus rhythm with sinus arrhythmia. No ischemia. No block. TTE pending.  Currently, the patient feels much better. Has some mild pain in her jaw and chin. Notably has been receiving dextrose to help with her glucose.  Denies any family history of known cardiovascular disease. No personal of family history of arrhythmias. No SCD or loss of a family member at a young age. She has no history of prior syncope. No lightheadedness with prolonged standing or other vagal symptoms.   History reviewed. No pertinent past medical history.  History reviewed. No pertinent surgical history.   Home Medications:  Prior to  Admission medications   Medication Sig Start Date End Date Taking? Authorizing Provider  cephALEXin (KEFLEX) 500 MG capsule Take 1 capsule (500 mg total) by mouth 4 (four) times daily for 7 days. 09/14/20 09/21/20  Elson Areas, PA-C    Inpatient Medications: Scheduled Meds:  Continuous Infusions: . dextrose 5 % and 0.9 % NaCl with KCl 20 mEq/L 100 mL/hr at 09/15/20 0242   PRN Meds: dextrose, ketorolac, ondansetron **OR** ondansetron (ZOFRAN) IV, oxyCODONE  Allergies:   No Known Allergies  Social History:   Social History   Socioeconomic History  . Marital status: Significant Other    Spouse name: Not on file  . Number of children: Not on file  . Years of education: Not on file  . Highest education level: Not on file  Occupational History  . Not on file  Tobacco Use  . Smoking status: Never Smoker  . Smokeless tobacco: Never Used  Vaping Use  . Vaping Use: Never used  Substance and Sexual Activity  . Alcohol use: Never  . Drug use: Never  . Sexual activity: Not on file  Other Topics Concern  . Not on file  Social History Narrative  . Not on file   Social Determinants of Health   Financial Resource Strain:   . Difficulty of Paying Living Expenses: Not on file  Food Insecurity:   . Worried About Programme researcher, broadcasting/film/video in the Last Year: Not on file  . Ran Out of Food in the Last Year: Not on file  Transportation Needs:   . Lack of Transportation (Medical): Not on  file  . Lack of Transportation (Non-Medical): Not on file  Physical Activity:   . Days of Exercise per Week: Not on file  . Minutes of Exercise per Session: Not on file  Stress:   . Feeling of Stress : Not on file  Social Connections:   . Frequency of Communication with Friends and Family: Not on file  . Frequency of Social Gatherings with Friends and Family: Not on file  . Attends Religious Services: Not on file  . Active Member of Clubs or Organizations: Not on file  . Attends Banker  Meetings: Not on file  . Marital Status: Not on file  Intimate Partner Violence:   . Fear of Current or Ex-Partner: Not on file  . Emotionally Abused: Not on file  . Physically Abused: Not on file  . Sexually Abused: Not on file    Family History:    Family History  Problem Relation Age of Onset  . Healthy Mother   . Healthy Father      ROS:  Please see the history of present illness.   All other ROS reviewed and negative.     Physical Exam/Data:   Vitals:   09/15/20 0515 09/15/20 0600 09/15/20 0700 09/15/20 0844  BP: (!) 89/50 95/70 (!) 94/51 97/61  Pulse: 72 77 69 66  Resp: 14 16 18 16   Temp:    99.1 F (37.3 C)  TempSrc:      SpO2: 98% 99% 98% 100%  Weight:      Height:        Intake/Output Summary (Last 24 hours) at 09/15/2020 1003 Last data filed at 09/15/2020 0242 Gross per 24 hour  Intake 396.2 ml  Output --  Net 396.2 ml   Last 3 Weights 09/14/2020  Weight (lbs) 135 lb 6.4 oz  Weight (kg) 61.417 kg     Body mass index is 23.99 kg/m.  General:  Well nourished, well developed, in no acute distress HEENT: normal Neck: no JVD Vascular: No carotid bruits; FA pulses 2+ bilaterally without bruits  Cardiac:  normal S1, S2; RRR; no murmur  Lungs:  clear to auscultation bilaterally, no wheezing, rhonchi or rales  Abd: soft, nontender, no hepatomegaly  Ext: no edema Musculoskeletal:  No deformities, BUE and BLE strength normal and equal Skin: warm and dry  Neuro:  CNs 2-12 intact, no focal abnormalities noted Psych:  Normal affect   EKG:  The EKG was personally reviewed and demonstrates:  Sinus rhythm with sinus arrhythmia. No ischemia. No block Telemetry:  Telemetry was personally reviewed and demonstrates:    Relevant CV Studies: TTE pending  Laboratory Data:  High Sensitivity Troponin:  No results for input(s): TROPONINIHS in the last 720 hours.   Chemistry Recent Labs  Lab 09/14/20 1414  NA 141  K 3.9  CL 103  CO2 27  GLUCOSE 87  BUN 12   CREATININE 0.70  CALCIUM 9.7  GFRNONAA >60  ANIONGAP 11    Recent Labs  Lab 09/15/20 0458  PROT 6.6  ALBUMIN 3.7  AST 19  ALT 15  ALKPHOS 30*  BILITOT 0.5   Hematology Recent Labs  Lab 09/14/20 1414  WBC 8.1  RBC 4.54  HGB 12.3  HCT 38.1  MCV 83.9  MCH 27.1  MCHC 32.3  RDW 13.9  PLT 235   BNPNo results for input(s): BNP, PROBNP in the last 168 hours.  DDimer No results for input(s): DDIMER in the last 168 hours.   Radiology/Studies:  CT Head Wo Contrast  Result Date: 09/14/2020 CLINICAL DATA:  Syncope EXAM: CT HEAD WITHOUT CONTRAST TECHNIQUE: Contiguous axial images were obtained from the base of the skull through the vertex without intravenous contrast. COMPARISON:  None. FINDINGS: Brain: No evidence of acute infarction, hemorrhage, hydrocephalus, extra-axial collection or mass lesion/mass effect. Vascular: No hyperdense vessel or unexpected calcification. Skull: Normal. Negative for fracture or focal lesion. Sinuses/Orbits: No acute finding. Other: None. IMPRESSION: No acute intracranial pathology. Electronically Signed   By: Lauralyn Primes M.D.   On: 09/14/2020 15:26   DG Shoulder Left  Result Date: 09/14/2020 CLINICAL DATA:  Pain after fall. EXAM: LEFT SHOULDER - 2+ VIEW COMPARISON:  None. FINDINGS: There is no evidence of fracture or dislocation. There is no evidence of arthropathy or other focal bone abnormality. Soft tissues are unremarkable. IMPRESSION: Negative. Electronically Signed   By: Gerome Sam III M.D   On: 09/14/2020 15:15     Assessment and Plan:   #Syncope with Collapse: Patient with episode of syncope after showering yesterday morning. Fell and hit her chin and fractured 2 molars on her right side. Only prodromal symptom was lightheadedness. No nausea, palpitations, chest pain or SOB. No recent alcohol or drug use. Ate normally the day before. No personal history of cardiac disease or prior syncopal spells. No family history of CV disease,  arrhythmias, SCD. The patient otherwise was feeling well with no fevers, chills or recent illnesses. Of note, has been requiring dextrose to keep up blood glucose. ECG without block or ischemia.  Unclear etiology of syncope. ? Hypoglycemia vs reflex syncope (vasovagal after hot shower vs orthostatsis). Less likely arrhythmogenic as ECG normal, no family or personal history of CV disease, and no history of palpitations.  -Follow-up TTE -Check orthostatics -Concerning that she has been requiring dextrose for hypoglycemia as this may be cause of underlying syncope; follow-up HgB A1C -Monitor on telemetry -If above work-up unrevealing, can consider long-term monitor as out-patient  #Hypoglycemia: Possibly underlying etiology of syncope. On dextrose. -Management per primary team -Follow-up A1C    For questions or updates, please contact [facility] Please consult [email or web address] for contact info under    Signed, Meriam Sprague, MD  09/15/2020 10:03 AM

## 2020-09-15 NOTE — ED Notes (Signed)
She is undergoing 2 D echo at this time.

## 2020-09-15 NOTE — Progress Notes (Signed)
PROGRESS NOTE    Pamela Bryant  BJS:[id number] DOB: [birth year] DOA: 09/14/2020 PCP: Pcp, No    Chief Complaint  Patient presents with  . Loss of Consciousness  . Laceration    Brief Narrative:  HPI per Dr Ponciano Ort is a 19 y.o. female with no previous past medical history who is coming to the emergency department after passing out at home.  Per patient, after she had waking up earlier, she took a shower around 1100 and felt a little lightheaded just before coming out of the shower.  When she went to her room, she felt like her vision was blacking out along with a fainting sensation, passed out and do not remember what else happened after the.  She hit herself on her chin and fractured 2 oh her premolars on her right side.  She initially had a headache, but this has since then disappeared.  She denies any other prodromal symptoms like dyspnea, chest pain/pressure, palpitations, diaphoresis, nausea or emesis.  She mentioned that she last ate ([employer]) the previous night around 2200.  She does not have a history of previous hypoglycemic or syncopal episodes.  She denies fever, chills, rhinorrhea, sore throat, wheezing or hemoptysis.  No abdominal pain, diarrhea, constipation, melena or hematochezia.  She denies dysuria, frequency or hematuria.  She denies polyuria, polydipsia, polyphagia or blurred vision.  ED Course: Initial vital signs were temperature 98.8 F, pulse 92, respiration 15, blood pressure 115/85 mmHg O2 sat 100% on room air.   Her urinalysis was hazy with moderate hemoglobinuria, ketonuria of 20 and proteinuria of 38 mg/dL.  Nitrites negative.  Leukocyte esterase is large with more than 50 WBC with 11-20 RBC per hpf.  However, bacteria are absent.  CBC and BMP were normal.  Serum quantitative hCG was negative.  Several measurements of capillary blood glucose have been hypoglycemic.   Assessment & Plan:   Principal Problem:   Syncopal episode Active Problems:    Hypoglycemia   Dental injury   Facial laceration   APC (atrial premature contractions)   Abnormal urinalysis   Syncope   1 syncope Questionable etiology. Differential includes cardiogenic etiology versus vasovagal versus orthostasis. Per admitting physician patient noted to have experienced some lightheadedness after getting out of the shower and subsequently syncopal episode. Patient denies any tongue biting or bowel or bladder incontinence. Orthostatics were not checked on admission and were checked after patient had received boluses of IV fluids and was subsequently negative. Patient however noted to have systolic blood pressure of 89 per ED records. Patient with no other prodrome symptoms. Head CT negative. 2D echo done this morning with a EF of 55 to 60%, no wall motion abnormalities, abnormal mitral valve, mild leaflet thickening without prolapse, trivial mitral valve regurgitation.  EKG with no ischemic changes noted. Patient noted to have a CBG as low as 54 however doubt that was the etiology of patient's syncopal episode. Hemoglobin A1c pending. Cardiology consulted and have assessed the patient. Placed on IV fluids of D5 normal saline at 125 cc an hour for the next 12 to 24 hours. Monitor CBGs. Monitor on telemetry. Supportive care. Follow.  2. Hypoglycemia Questionable etiology. Placed on D5 normal saline 125 cc an hour for the next 12 to 24 hours. Follow CBGs every 4 hours. If blood glucose levels remained improved will discontinue D5 normal saline and monitor CBGs. Hemoglobin A1c ordered and pending. Follow.  3. Dental injury Outpatient follow-up with dentist.  4. Facial laceration Continue local wound  care.  5. Abnormal urinalysis Urinalysis with large leukocytes, nitrite negative, greater than 50 WBCs. Urine cultures pending. Placed empirically on IV Rocephin. Supportive care. Follow.   DVT prophylaxis: Lovenox. Code Status: Full Family Communication: Updated patient.  No  family at bedside. Disposition:   Status is: Inpatient    Dispo: The patient is from: Home              Anticipated d/c is to: Home              Anticipated d/c date is: 1 to 2 days.              Patient currently being evaluated for syncopal episode, being monitored on telemetry.  Not stable for discharge.       Consultants:   Cardiology: Dr. Shari Prows 09/15/2020  Procedures:   2D echo 09/15/2020  Plain films of the left shoulder 09/14/2020  CT head 09/14/2020  Antimicrobials:   IV Rocephin 09/15/2020   Subjective: In ED, getting orthostatics done.  Denies chest pain.  No shortness of breath.  Objective: Vitals:   09/15/20 1231 09/15/20 1334 09/15/20 1427 09/15/20 1610  BP: 91/75 102/67 104/64 111/67  Pulse: 79 73 75 94  Resp: 16 (!) 75 16   Temp:    99.4 F (37.4 C)  TempSrc:    Oral  SpO2: 99% 100% 100% 100%  Weight:      Height:        Intake/Output Summary (Last 24 hours) at 09/15/2020 1632 Last data filed at 09/15/2020 0242 Gross per 24 hour  Intake 396.2 ml  Output --  Net 396.2 ml   Filed Weights   09/14/20 1242  Weight: 61.4 kg    Examination:  General exam: Appears calm and comfortable  Respiratory system: Clear to auscultation. Respiratory effort normal. Cardiovascular system: Regular rate rhythm with some extrasystoles noted.  No JVD.  No murmurs rubs or gallops.  No lower extremity edema.  Gastrointestinal system: Abdomen is nondistended, soft and nontender. No organomegaly or masses felt. Normal bowel sounds heard. Central nervous system: Alert and oriented. No focal neurological deficits. Extremities: Symmetric 5 x 5 power. Skin: Left chin area with laceration.  Psychiatry: Judgement and insight appear normal. Mood & affect appropriate.     Data Reviewed: I have personally reviewed following labs and imaging studies  CBC: Recent Labs  Lab 09/14/20 1414  WBC 8.1  HGB 12.3  HCT 38.1  MCV 83.9  PLT 235    Basic  Metabolic Panel: Recent Labs  Lab 09/14/20 1414 09/15/20 0458  NA 141  --   K 3.9  --   CL 103  --   CO2 27  --   GLUCOSE 87  --   BUN 12  --   CREATININE 0.70  --   CALCIUM 9.7  --   MG  --  1.8  PHOS  --  4.1    GFR: Estimated Creatinine Clearance: 93.6 mL/min (by C-G formula based on SCr of 0.7 mg/dL).  Liver Function Tests: Recent Labs  Lab 09/15/20 0458  AST 19  ALT 15  ALKPHOS 30*  BILITOT 0.5  PROT 6.6  ALBUMIN 3.7    CBG: Recent Labs  Lab 09/14/20 2022 09/15/20 0011 09/15/20 0455 09/15/20 0848 09/15/20 1229  GLUCAP 205* 109* 105* 79 85     Recent Results (from the past 240 hour(s))  Resp Panel by RT PCR (RSV, Flu A&B, Covid) - Nasopharyngeal Swab     Status: None  Collection Time: 09/15/20 10:10 AM   Specimen: Nasopharyngeal Swab  Result Value Ref Range Status   SARS Coronavirus 2 by RT PCR NEGATIVE NEGATIVE Final    Comment: (NOTE) SARS-CoV-2 target nucleic acids are NOT DETECTED.  The SARS-CoV-2 RNA is generally detectable in upper respiratoy specimens during the acute phase of infection. The lowest concentration of SARS-CoV-2 viral copies this assay can detect is 131 copies/mL. A negative result does not preclude SARS-Cov-2 infection and should not be used as the sole basis for treatment or other patient management decisions. A negative result may occur with  improper specimen collection/handling, submission of specimen other than nasopharyngeal swab, presence of viral mutation(s) within the areas targeted by this assay, and inadequate number of viral copies (<131 copies/mL). A negative result must be combined with clinical observations, patient history, and epidemiological information. The expected result is Negative.  Fact Sheet for Patients:  https://www.moore.com/  Fact Sheet for Healthcare Providers:  https://www.young.biz/  This test is no t yet approved or cleared by the [place] FDA  and  has been authorized for detection and/or diagnosis of SARS-CoV-2 by FDA under an Emergency Use Authorization (EUA). This EUA will remain  in effect (meaning this test can be used) for the duration of the COVID-19 declaration under Section 564(b)(1) of the Act, 21 U.S.C. section 360bbb-3(b)(1), unless the authorization is terminated or revoked sooner.     Influenza A by PCR NEGATIVE NEGATIVE Final   Influenza B by PCR NEGATIVE NEGATIVE Final    Comment: (NOTE) The Xpert Xpress SARS-CoV-2/FLU/RSV assay is intended as an aid in  the diagnosis of influenza from Nasopharyngeal swab specimens and  should not be used as a sole basis for treatment. Nasal washings and  aspirates are unacceptable for Xpert Xpress SARS-CoV-2/FLU/RSV  testing.  Fact Sheet for Patients: https://www.moore.com/  Fact Sheet for Healthcare Providers: https://www.young.biz/  This test is not yet approved or cleared by the [place] FDA and  has been authorized for detection and/or diagnosis of SARS-CoV-2 by  FDA under an Emergency Use Authorization (EUA). This EUA will remain  in effect (meaning this test can be used) for the duration of the  Covid-19 declaration under Section 564(b)(1) of the Act, 21  U.S.C. section 360bbb-3(b)(1), unless the authorization is  terminated or revoked.    Respiratory Syncytial Virus by PCR NEGATIVE NEGATIVE Final    Comment: (NOTE) Fact Sheet for Patients: https://www.moore.com/  Fact Sheet for Healthcare Providers: https://www.young.biz/  This test is not yet approved or cleared by the [place] FDA and  has been authorized for detection and/or diagnosis of SARS-CoV-2 by  FDA under an Emergency Use Authorization (EUA). This EUA will remain  in effect (meaning this test can be used) for the duration of the  COVID-19 declaration under Section 564(b)(1) of the Act, 21 U.S.C.  section  360bbb-3(b)(1), unless the authorization is terminated or  revoked. Performed at [facility], [address], [place], [place] [address]          Radiology Studies: CT Head Wo Contrast  Result Date: 09/14/2020 CLINICAL DATA:  Syncope EXAM: CT HEAD WITHOUT CONTRAST TECHNIQUE: Contiguous axial images were obtained from the base of the skull through the vertex without intravenous contrast. COMPARISON:  None. FINDINGS: Brain: No evidence of acute infarction, hemorrhage, hydrocephalus, extra-axial collection or mass lesion/mass effect. Vascular: No hyperdense vessel or unexpected calcification. Skull: Normal. Negative for fracture or focal lesion. Sinuses/Orbits: No acute finding. Other: None. IMPRESSION: No acute intracranial pathology.  Electronically Signed   By: Lauralyn Primes M.D.   On: 09/14/2020 15:26   DG Shoulder Left  Result Date: 09/14/2020 CLINICAL DATA:  Pain after fall. EXAM: LEFT SHOULDER - 2+ VIEW COMPARISON:  None. FINDINGS: There is no evidence of fracture or dislocation. There is no evidence of arthropathy or other focal bone abnormality. Soft tissues are unremarkable. IMPRESSION: Negative. Electronically Signed   By: Gerome Sam III M.D   On: 09/14/2020 15:15   ECHOCARDIOGRAM COMPLETE  Result Date: 09/15/2020    ECHOCARDIOGRAM REPORT   Patient Name:   Pamela Bryant Date of Exam: 09/15/2020 Medical Rec #:  [id number]     Height:       63.0 in Accession #:    [id number]    Weight:       135.4 lb Date of Birth:  2001-08-13    BSA:          1.638 m Patient Age:    18 years      BP:           97/61 mmHg Patient Gender: F             HR:           64 bpm. Exam Location:  Inpatient Procedure: 2D Echo, Cardiac Doppler and Color Doppler Indications:    R55 Syncope  History:        Patient has no prior history of Echocardiogram examinations.  Sonographer:    Elmarie Shiley Dance Referring Phys: [id number] DAVID MANUEL ORTIZ IMPRESSIONS  1. Left ventricular ejection fraction,  by estimation, is 55 to 60%. The left ventricle has normal function. The left ventricle has no regional wall motion abnormalities. Left ventricular diastolic parameters were normal.  2. Right ventricular systolic function is normal. The right ventricular size is normal.  3. The mitral valve is abnormal. Mild leaflet thickening without prolapse. Trivial mitral valve regurgitation.  4. The aortic valve is tricuspid. Aortic valve regurgitation is not visualized. Conclusion(s)/Recommendation(s): Otherwise normal echocardiogram, with minor abnormalities described in the report. FINDINGS  Left Ventricle: Left ventricular ejection fraction, by estimation, is 55 to 60%. The left ventricle has normal function. The left ventricle has no regional wall motion abnormalities. The left ventricular internal cavity size was normal in size. There is  no left ventricular hypertrophy. Left ventricular diastolic parameters were normal. Right Ventricle: The right ventricular size is normal. No increase in right ventricular wall thickness. Right ventricular systolic function is normal. Left Atrium: Left atrial size was normal in size. Right Atrium: Right atrial size was normal in size. Pericardium: There is no evidence of pericardial effusion. Mitral Valve: The mitral valve is abnormal. There is mild thickening of the mitral valve leaflet(s). Trivial mitral valve regurgitation. Tricuspid Valve: The tricuspid valve is grossly normal. Tricuspid valve regurgitation is trivial. Aortic Valve: The aortic valve is tricuspid. Aortic valve regurgitation is not visualized. Pulmonic Valve: The pulmonic valve was grossly normal. Pulmonic valve regurgitation is trivial. Aorta: The aortic root and ascending aorta are structurally normal, with no evidence of dilitation. IAS/Shunts: No atrial level shunt detected by color flow Doppler.  LEFT VENTRICLE PLAX 2D LVIDd:         3.60 cm LVIDs:         2.90 cm LV PW:         0.90 cm LV IVS:        0.60 cm LVOT  diam:     2.00 cm LV SV:  47 LV SV Index:   29 LVOT Area:     3.14 cm  RIGHT VENTRICLE             IVC RV Basal diam:  2.50 cm     IVC diam: 1.30 cm RV S prime:     10.40 cm/s TAPSE (M-mode): 1.7 cm LEFT ATRIUM             Index       RIGHT ATRIUM           Index LA diam:        2.90 cm 1.77 cm/m  RA Area:     10.40 cm LA Vol (A2C):   58.2 ml 35.53 ml/m RA Volume:   20.20 ml  12.33 ml/m LA Vol (A4C):   33.9 ml 20.69 ml/m LA Biplane Vol: 45.1 ml 27.53 ml/m  AORTIC VALVE LVOT Vmax:   80.10 cm/s LVOT Vmean:  49.000 cm/s LVOT VTI:    0.151 m  AORTA Ao Root diam: 2.80 cm Ao Asc diam:  2.40 cm MITRAL VALVE MV Area (PHT): 3.60 cm    SHUNTS MV Decel Time: 211 msec    Systemic VTI:  0.15 m MV E velocity: 70.30 cm/s  Systemic Diam: 2.00 cm MV A velocity: 37.70 cm/s MV E/A ratio:  1.86 Zoila ShutterKenneth Hilty MD Electronically signed by Zoila ShutterKenneth Hilty MD Signature Date/Time: 09/15/2020/1:16:41 PM    Final         Scheduled Meds: . enoxaparin (LOVENOX) injection  30 mg Subcutaneous Q24H   Continuous Infusions: . cefTRIAXone (ROCEPHIN)  IV 1 g (09/15/20 1249)  . dextrose 5 % and 0.9% NaCl 125 mL/hr at 09/15/20 1502     LOS: 0 days    Time spent: 35 minutes    Ramiro Harvestaniel Rigley Niess, MD [facility]   To contact the attending provider between 7A-7P or the covering provider during after hours 7P-7A, please log into the web site [email or web address] and access using universal [facility] password for that web site. If you do not have the password, please call the hospital operator.  09/15/2020, 4:32 PM

## 2020-09-15 NOTE — Plan of Care (Signed)

## 2020-09-15 NOTE — ED Notes (Signed)
She has been, and continues to be awake and in no distress. She is oriented x 4 wit clear speech. She has no c/o, except for lack of appetite. She has a friend with her at bedside.

## 2020-09-15 NOTE — Progress Notes (Signed)
  Echocardiogram 2D Echocardiogram has been performed.  Pamela Bryant 09/15/2020, 8:42 AM

## 2020-09-16 ENCOUNTER — Other Ambulatory Visit: Payer: Self-pay | Admitting: Student

## 2020-09-16 DIAGNOSIS — R55 Syncope and collapse: Secondary | ICD-10-CM

## 2020-09-16 DIAGNOSIS — R8290 Unspecified abnormal findings in urine: Secondary | ICD-10-CM | POA: Diagnosis not present

## 2020-09-16 DIAGNOSIS — S0181XA Laceration without foreign body of other part of head, initial encounter: Secondary | ICD-10-CM | POA: Diagnosis not present

## 2020-09-16 DIAGNOSIS — S0993XD Unspecified injury of face, subsequent encounter: Secondary | ICD-10-CM | POA: Diagnosis not present

## 2020-09-16 LAB — BASIC METABOLIC PANEL
Anion gap: 7 (ref 5–15)
BUN: 9 mg/dL (ref 6–20)
CO2: 25 mmol/L (ref 22–32)
Calcium: 8.3 mg/dL — ABNORMAL LOW (ref 8.9–10.3)
Chloride: 107 mmol/L (ref 98–111)
Creatinine, Ser: 0.75 mg/dL (ref 0.44–1.00)
GFR, Estimated: >60 mL/min (ref >60)
Glucose, Bld: 108 mg/dL — ABNORMAL HIGH (ref 70–99)
Potassium: 3.3 mmol/L — ABNORMAL LOW (ref 3.5–5.1)
Sodium: 139 mmol/L (ref 135–145)

## 2020-09-16 LAB — GLUCOSE, CAPILLARY
Glucose-Capillary: 61 mg/dL — ABNORMAL LOW (ref 70–99)
Glucose-Capillary: 65 mg/dL — ABNORMAL LOW (ref 70–99)
Glucose-Capillary: 76 mg/dL (ref 70–99)
Glucose-Capillary: 84 mg/dL (ref 70–99)
Glucose-Capillary: 92 mg/dL (ref 70–99)

## 2020-09-16 LAB — URINE CULTURE
Culture: >=100000 — AB
Special Requests: NORMAL

## 2020-09-16 LAB — HEMOGLOBIN A1C
Hgb A1c MFr Bld: 5.5 % (ref 4.8–5.6)
Mean Plasma Glucose: 111 mg/dL

## 2020-09-16 LAB — MAGNESIUM: Magnesium: 2.1 mg/dL (ref 1.7–2.4)

## 2020-09-16 MED ORDER — POTASSIUM CHLORIDE CRYS ER 20 MEQ PO TBCR
40.0000 meq | EXTENDED_RELEASE_TABLET | Freq: Once | ORAL | Status: AC
  Administered 2020-09-16: 40 meq via ORAL
  Filled 2020-09-16: qty 2

## 2020-09-16 MED ORDER — CEPHALEXIN 500 MG PO CAPS: 500.0000 mg | ORAL_CAPSULE | Freq: Four times a day (QID) | ORAL | 0 refills | Status: AC

## 2020-09-16 NOTE — Progress Notes (Signed)
Ordered Event Monitor for further evaluation of syncope. Please see rounding note from today for more information.  Corrin Parker, PA-C 09/16/2020 3:18 PM

## 2020-09-16 NOTE — Discharge Summary (Signed)
Physician Discharge Summary  Pamela Bryant JJK:[id number] DOB: [birth year] DOA: 09/14/2020  PCP: Pcp, No  Admit date: 09/14/2020 Discharge date: 09/16/2020  Time spent: 55 minutes  Recommendations for Outpatient Follow-up:  1. Follow-up with PCP in 1 to 2 weeks.  On follow-up patient will need a basic metabolic profile done to follow-up on electrolytes and renal function.  Follow-up on probable vasovagal syncope. 2. Follow-up at [facility] heart care with Dr. Shari Prows 10/21/2020 at 10:40 AM. 3. Patient will be discharged home and will be set up with an event monitor in the outpatient setting. 4. Follow-up with Hillery Hunter, DMD, dentist for evaluation of broken teeth.   Discharge Diagnoses:  Principal Problem:   Syncopal episode Active Problems:   Hypoglycemia   Dental injury   Facial laceration   APC (atrial premature contractions)   Abnormal urinalysis   Syncope   Discharge Condition: Stable and improved  Diet recommendation: Regular  Filed Weights   09/14/20 1242  Weight: 61.4 kg    History of present illness:  HPI per Dr. Ponciano Bryant is a 19 y.o. female with no previous past medical history who is coming to the emergency department after passing out at home.  Per patient, after she had waking up earlier, she took a shower around 1100 and felt a little lightheaded just before coming out of the shower.  When she went to her room, she felt like her vision was blacking out along with a fainting sensation, passed out and do not remember what else happened after the.  She hit herself on her chin and fractured 2 oh her premolars on her right side.  She initially had a headache, but this has since then disappeared.  She denies any other prodromal symptoms like dyspnea, chest pain/pressure, palpitations, diaphoresis, nausea or emesis.  She mentioned that she last ate ([employer]) the previous night around 2200.  She does not have a history of previous hypoglycemic or syncopal  episodes.  She denies fever, chills, rhinorrhea, sore throat, wheezing or hemoptysis.  No abdominal pain, diarrhea, constipation, melena or hematochezia.  She denies dysuria, frequency or hematuria.  She denies polyuria, polydipsia, polyphagia or blurred vision.  ED Course: Initial vital signs were temperature 98.8 F, pulse 92, respiration 15, blood pressure 115/85 mmHg O2 sat 100% on room air.   Her urinalysis was hazy with moderate hemoglobinuria, ketonuria of 20 and proteinuria of 38 mg/dL.  Nitrites negative.  Leukocyte esterase is large with more than 50 WBC with 11-20 RBC per hpf.  However, bacteria are absent.  CBC and BMP were normal.  Serum quantitative hCG was negative.  Several measurements of capillary blood glucose have been hypoglycemic.  Review of Systems: As per HPI otherwise all other systems reviewed and are negative.  Hospital Course:  1 syncope Questionable etiology.  Likely a vasovagal event as patient noted to have a syncopal episode after getting out of the shower.  There was also concern for cardiogenic event versus orthostasis.  Patient subsequently admitted and placed on telemetry.  Orthostatics which were checked were negative however these were done after patient was hydrated. Per admitting physician patient noted to have experienced some lightheadedness after getting out of the shower and subsequently syncopal episode. Patient denied any tongue biting or bowel or bladder incontinence. Orthostatics were not checked on admission and were checked after patient had received boluses of IV fluids and was subsequently negative. Patient however noted to have systolic blood pressure of 89 per ED records. Patient with no  other prodrome symptoms. Head CT negative. 2D echo done with a EF of 55 to 60%, no wall motion abnormalities, abnormal mitral valve, mild leaflet thickening without prolapse, trivial mitral valve regurgitation.  EKG with no ischemic changes noted. Patient noted to  have a CBG as low as 54 however doubt that was the etiology of patient's syncopal episode. Hemoglobin A1c pending. Cardiology consulted and have assessed the patient.  Patient improved clinically did not have any further syncopal episodes and was cleared from a cardiac standpoint for discharge home.  Cardiac monitor will be arranged and to be mailed to the patient for continued surveillance with outpatient follow-up with cardiology.  Outpatient follow-up with PCP.  2. Hypoglycemia Questionable etiology. Placed on D5 normal saline 125 cc an hour with improvement with blood glucose levels.  Patient remained asymptomatic.  IV fluids were saline locked and CBGs noted to be stable in the mid 60s and patient remained asymptomatic.  Patient be discharged home in stable and improved condition.  Outpatient follow-up with PCP.   3. Dental injury Outpatient follow-up with dentist.  4. Facial laceration Continue local wound care.  5. Abnormal urinalysis Urinalysis with large leukocytes, nitrite negative, greater than 50 WBCs. Urine cultures pending.  Patient started on IV Rocephin during the hospitalization as patient noted to have some borderline low blood glucose levels.  Patient was discharged home on 2 more days of Keflex to complete a 3-day course of antibiotic treatment.  Outpatient follow-up with PCP.     Procedures:  2D echo 09/15/2020  Plain films of the left shoulder 09/14/2020  CT head 09/14/2020  Consultations:  Cardiology: Dr. Shari Prows 09/15/2020  Discharge Exam: Vitals:   09/16/20 0016 09/16/20 0410  BP: 117/86 109/62  Pulse: 73 (!) 48  Resp: 20 16  Temp: 99.1 F (37.3 C) 97.6 F (36.4 C)  SpO2: 100% 100%    General: NAD Cardiovascular: RRR Respiratory: CTAB  Discharge Instructions   Discharge Instructions    Diet general   Complete by: As directed    Increase activity slowly   Complete by: As directed      Allergies as of 09/16/2020   No Known  Allergies     Medication List    TAKE these medications   cephALEXin 500 MG capsule Commonly known as: KEFLEX Take 1 capsule (500 mg total) by mouth 4 (four) times daily for 2 days.      No Known Allergies  Follow-up Information    Loura Pardon, DMD.   Specialty: Dentistry Why: Schedule to see the Dentist for evaluation of broken teeth Contact information: [address] [place] [address] [phone]        [facility] Office Follow up.   Specialty: Cardiology Why: Hospital follow-up scheduled with Dr. Shari Prows for 10/21/2020 at 10:40am. Please arrive 15 minutes early for check-in. If this date/time does not work for you, please call our office to reschedule. Our office will also call your to arrange heart monitor Contact information: [address] [place] [address] [phone]       PCP. Schedule an appointment as soon as possible for a visit in 2 week(s).   Why: F/U IN 1-2 WEEKS.               The results of significant diagnostics from this hospitalization (including imaging, microbiology, ancillary and laboratory) are listed below for reference.    Significant Diagnostic Studies: CT Head Wo Contrast  Result Date: 09/14/2020 CLINICAL DATA:  Syncope EXAM: CT HEAD WITHOUT CONTRAST TECHNIQUE: Contiguous axial images were obtained from the base of the skull through the vertex without intravenous contrast. COMPARISON:  None. FINDINGS: Brain: No evidence of acute infarction, hemorrhage, hydrocephalus, extra-axial collection or mass lesion/mass effect. Vascular: No hyperdense vessel or unexpected calcification. Skull: Normal. Negative for fracture or focal lesion. Sinuses/Orbits: No acute finding. Other: None. IMPRESSION: No acute intracranial pathology. Electronically Signed   By: Lauralyn Primes M.D.   On: 09/14/2020 15:26   DG Shoulder Left  Result Date: 09/14/2020 CLINICAL DATA:  Pain after fall. EXAM: LEFT  SHOULDER - 2+ VIEW COMPARISON:  None. FINDINGS: There is no evidence of fracture or dislocation. There is no evidence of arthropathy or other focal bone abnormality. Soft tissues are unremarkable. IMPRESSION: Negative. Electronically Signed   By: Gerome Sam III M.D   On: 09/14/2020 15:15   ECHOCARDIOGRAM COMPLETE  Result Date: 09/15/2020    ECHOCARDIOGRAM REPORT   Patient Name:   JAYLENN BAIZA Date of Exam: 09/15/2020 Medical Rec #:  [id number]     Height:       63.0 in Accession #:    [id number]    Weight:       135.4 lb Date of Birth:  [birth year]    BSA:          1.638 m Patient Age:    18 years      BP:           97/61 mmHg Patient Gender: F             HR:           64 bpm. Exam Location:  Inpatient Procedure: 2D Echo, Cardiac Doppler and Color Doppler Indications:    R55 Syncope  History:        Patient has no prior history of Echocardiogram examinations.  Sonographer:    Elmarie Shiley Dance Referring Phys: [id number] DAVID MANUEL ORTIZ IMPRESSIONS  1. Left ventricular ejection fraction, by estimation, is 55 to 60%. The left ventricle has normal function. The left ventricle has no regional wall motion abnormalities. Left ventricular diastolic parameters were normal.  2. Right ventricular systolic function is normal. The right ventricular size is normal.  3. The mitral valve is abnormal. Mild leaflet thickening without prolapse. Trivial mitral valve regurgitation.  4. The aortic valve is tricuspid. Aortic valve regurgitation is not visualized. Conclusion(s)/Recommendation(s): Otherwise normal echocardiogram, with minor abnormalities described in the report. FINDINGS  Left Ventricle: Left ventricular ejection fraction, by estimation, is 55 to 60%. The left ventricle has normal function. The left ventricle has no regional wall motion abnormalities. The left ventricular internal cavity size was normal in size. There is  no left ventricular hypertrophy. Left ventricular diastolic parameters were normal. Right  Ventricle: The right ventricular size is normal. No increase in right ventricular wall thickness. Right ventricular systolic function is normal. Left Atrium: Left atrial size was normal in size. Right Atrium: Right atrial size was normal in size. Pericardium: There is no evidence of pericardial effusion. Mitral Valve: The mitral valve is abnormal. There is mild thickening of the mitral valve leaflet(s). Trivial mitral valve regurgitation. Tricuspid Valve: The tricuspid valve is grossly normal. Tricuspid valve regurgitation is trivial. Aortic Valve: The aortic valve is tricuspid. Aortic valve regurgitation is not visualized. Pulmonic Valve: The pulmonic valve was grossly normal. Pulmonic valve regurgitation is trivial. Aorta: The aortic root and ascending aorta are structurally normal, with no evidence of dilitation. IAS/Shunts: No atrial level shunt detected by color  flow Doppler.  LEFT VENTRICLE PLAX 2D LVIDd:         3.60 cm LVIDs:         2.90 cm LV PW:         0.90 cm LV IVS:        0.60 cm LVOT diam:     2.00 cm LV SV:         47 LV SV Index:   29 LVOT Area:     3.14 cm  RIGHT VENTRICLE             IVC RV Basal diam:  2.50 cm     IVC diam: 1.30 cm RV S prime:     10.40 cm/s TAPSE (M-mode): 1.7 cm LEFT ATRIUM             Index       RIGHT ATRIUM           Index LA diam:        2.90 cm 1.77 cm/m  RA Area:     10.40 cm LA Vol (A2C):   58.2 ml 35.53 ml/m RA Volume:   20.20 ml  12.33 ml/m LA Vol (A4C):   33.9 ml 20.69 ml/m LA Biplane Vol: 45.1 ml 27.53 ml/m  AORTIC VALVE LVOT Vmax:   80.10 cm/s LVOT Vmean:  49.000 cm/s LVOT VTI:    0.151 m  AORTA Ao Root diam: 2.80 cm Ao Asc diam:  2.40 cm MITRAL VALVE MV Area (PHT): 3.60 cm    SHUNTS MV Decel Time: 211 msec    Systemic VTI:  0.15 m MV E velocity: 70.30 cm/s  Systemic Diam: 2.00 cm MV A velocity: 37.70 cm/s MV E/A ratio:  1.86 Zoila Shutter MD Electronically signed by Zoila Shutter MD Signature Date/Time: 09/15/2020/1:16:41 PM    Final      Microbiology: Recent Results (from the past 240 hour(s))  Urine Culture     Status: None (Preliminary result)   Collection Time: 09/14/20  7:36 PM   Specimen: Urine, Clean Catch  Result Value Ref Range Status   Specimen Description   Final    URINE, CLEAN CATCH Performed at [facility], [address] [place], [place] [address]    Special Requests   Final    Normal Performed at [facility], [address], [place], [place] [address]    Culture   Final    CULTURE REINCUBATED FOR BETTER GROWTH Performed at [facility], [address] [place], [place] [address]    Report Status PENDING  Incomplete  Resp Panel by RT PCR (RSV, Flu A&B, Covid) - Nasopharyngeal Swab     Status: None   Collection Time: 09/15/20 10:10 AM   Specimen: Nasopharyngeal Swab  Result Value Ref Range Status   SARS Coronavirus 2 by RT PCR NEGATIVE NEGATIVE Final    Comment: (NOTE) SARS-CoV-2 target nucleic acids are NOT DETECTED.  The SARS-CoV-2 RNA is generally detectable in upper respiratoy specimens during the acute phase of infection. The lowest concentration of SARS-CoV-2 viral copies this assay can detect is 131 copies/mL. A negative result does not preclude SARS-Cov-2 infection and should not be used as the sole basis for treatment or other patient management decisions. A negative result may occur with  improper specimen collection/handling, submission of specimen other than nasopharyngeal swab, presence of viral mutation(s) within the areas targeted by this assay, and inadequate number of viral copies (<131 copies/mL). A negative result must be  combined with clinical observations, patient history, and epidemiological information. The expected result is Negative.  Fact Sheet for Patients:  https://www.moore.com/  Fact Sheet for Healthcare Providers:  https://www.young.biz/  This test is no t yet approved or  cleared by the [place] FDA and  has been authorized for detection and/or diagnosis of SARS-CoV-2 by FDA under an Emergency Use Authorization (EUA). This EUA will remain  in effect (meaning this test can be used) for the duration of the COVID-19 declaration under Section 564(b)(1) of the Act, 21 U.S.C. section 360bbb-3(b)(1), unless the authorization is terminated or revoked sooner.     Influenza A by PCR NEGATIVE NEGATIVE Final   Influenza B by PCR NEGATIVE NEGATIVE Final    Comment: (NOTE) The Xpert Xpress SARS-CoV-2/FLU/RSV assay is intended as an aid in  the diagnosis of influenza from Nasopharyngeal swab specimens and  should not be used as a sole basis for treatment. Nasal washings and  aspirates are unacceptable for Xpert Xpress SARS-CoV-2/FLU/RSV  testing.  Fact Sheet for Patients: https://www.moore.com/  Fact Sheet for Healthcare Providers: https://www.young.biz/  This test is not yet approved or cleared by the [place] FDA and  has been authorized for detection and/or diagnosis of SARS-CoV-2 by  FDA under an Emergency Use Authorization (EUA). This EUA will remain  in effect (meaning this test can be used) for the duration of the  Covid-19 declaration under Section 564(b)(1) of the Act, 21  U.S.C. section 360bbb-3(b)(1), unless the authorization is  terminated or revoked.    Respiratory Syncytial Virus by PCR NEGATIVE NEGATIVE Final    Comment: (NOTE) Fact Sheet for Patients: https://www.moore.com/  Fact Sheet for Healthcare Providers: https://www.young.biz/  This test is not yet approved or cleared by the [place] FDA and  has been authorized for detection and/or diagnosis of SARS-CoV-2 by  FDA under an Emergency Use Authorization (EUA). This EUA will remain  in effect (meaning this test can be used) for the duration of the  COVID-19 declaration under Section 564(b)(1) of  the Act, 21 U.S.C.  section 360bbb-3(b)(1), unless the authorization is terminated or  revoked. Performed at [facility], [address], [place], [place] [address]      Labs: Basic Metabolic Panel: Recent Labs  Lab 09/14/20 1414 09/15/20 0458 09/16/20 0458  NA 141  --  139  K 3.9  --  3.3*  CL 103  --  107  CO2 27  --  25  GLUCOSE 87  --  108*  BUN 12  --  9  CREATININE 0.70  --  0.75  CALCIUM 9.7  --  8.3*  MG  --  1.8 2.1  PHOS  --  4.1  --    Liver Function Tests: Recent Labs  Lab 09/15/20 0458  AST 19  ALT 15  ALKPHOS 30*  BILITOT 0.5  PROT 6.6  ALBUMIN 3.7   No results for input(s): LIPASE, AMYLASE in the last 168 hours. No results for input(s): AMMONIA in the last 168 hours. CBC: Recent Labs  Lab 09/14/20 1414  WBC 8.1  HGB 12.3  HCT 38.1  MCV 83.9  PLT 235   Cardiac Enzymes: No results for input(s): CKTOTAL, CKMB, CKMBINDEX, TROPONINI in the last 168 hours. BNP: BNP (last 3 results) No results for input(s): BNP in the last 8760 hours.  ProBNP (last 3 results) No results for input(s): PROBNP in the last 8760 hours.  CBG: Recent Labs  Lab 09/16/20 0006 09/16/20 0408 09/16/20 0747 09/16/20  1135 09/16/20 1204  GLUCAP 84 92 76 61* 65*       Signed:  Ramiro Harvestaniel Dimples Probus MD.  [facility] 09/16/2020, 1:16 PM

## 2020-09-16 NOTE — Progress Notes (Signed)
Pt discharged to home.  MD discussed discharge plans with patient's mother.  DC paperwork reviewed and given to patient.  RN [employer] number provided to patient if she has questions once she gets home.  Also provided Medicaid number to patient for setting up PCP. Nino Parsley

## 2020-09-16 NOTE — Progress Notes (Addendum)
Progress Note  Patient Name: Pamela Bryant Date of Encounter: 09/16/2020  [facility] Cardiologist: Meriam Sprague, MD ([place])  Subjective   No acute overnight events. No recurrent syncope or lightheadedness. No chest pain or palpitations. Blood sugars have stabilized. Orthostatics negative yesterday but BP was soft. Eager to go home.  Inpatient Medications    Scheduled Meds: . enoxaparin (LOVENOX) injection  30 mg Subcutaneous Q24H  . influenza vac split quadrivalent PF  0.5 mL Intramuscular Tomorrow-1000   Continuous Infusions: . cefTRIAXone (ROCEPHIN)  IV Stopped (09/15/20 2337)  . dextrose 5 % and 0.9% NaCl 125 mL/hr at 09/16/20 0600   PRN Meds: dextrose, ondansetron **OR** ondansetron (ZOFRAN) IV, oxyCODONE   Vital Signs    Vitals:   09/15/20 1610 09/15/20 2011 09/16/20 0016 09/16/20 0410  BP: 111/67 118/85 117/86 109/62  Pulse: 94 74 73 (!) 48  Resp:  17 20 16   Temp: 99.4 F (37.4 C) 99 F (37.2 C) 99.1 F (37.3 C) 97.6 F (36.4 C)  TempSrc: Oral Oral Oral Oral  SpO2: 100% 100% 100% 100%  Weight:      Height:       Orthostatic VS for the past 24 hrs:  BP- Lying Pulse- Lying BP- Sitting Pulse- Sitting BP- Standing at 0 minutes Pulse- Standing at 0 minutes  09/15/20 1232 91/75 79 104/67 78 (!) 106/95 108      Intake/Output Summary (Last 24 hours) at 09/16/2020 0808 Last data filed at 09/16/2020 0600 Gross per 24 hour  Intake 955.65 ml  Output --  Net 955.65 ml   Last 3 Weights 09/14/2020  Weight (lbs) 135 lb 6.4 oz  Weight (kg) 61.417 kg      Telemetry    Sinus rhythm/sinus arrhythmia with PACs. Baseline rates in the 50's to 70's. - Personally Reviewed  ECG    No new ECG tracing today. - Personally Reviewed  Physical Exam   GEN: No acute distress.   Neck: Supple. Cardiac: RRR. No murmurs, rubs, or gallops.  Respiratory: Clear to auscultation bilaterally. GI: Soft, non-tender, non-distended  MS: No lower extremity edema. No  deformity. Skin: Warm and dry. Neuro:  No focal deficits. Psych: Normal affect. Responds appropriately.  Labs    High Sensitivity Troponin:  No results for input(s): TROPONINIHS in the last 720 hours.    Chemistry Recent Labs  Lab 09/14/20 1414 09/15/20 0458 09/16/20 0458  NA 141  --  139  K 3.9  --  3.3*  CL 103  --  107  CO2 27  --  25  GLUCOSE 87  --  108*  BUN 12  --  9  CREATININE 0.70  --  0.75  CALCIUM 9.7  --  8.3*  PROT  --  6.6  --   ALBUMIN  --  3.7  --   AST  --  19  --   ALT  --  15  --   ALKPHOS  --  30*  --   BILITOT  --  0.5  --   GFRNONAA >60  --  >60  ANIONGAP 11  --  7     Hematology Recent Labs  Lab 09/14/20 1414  WBC 8.1  RBC 4.54  HGB 12.3  HCT 38.1  MCV 83.9  MCH 27.1  MCHC 32.3  RDW 13.9  PLT 235    BNPNo results for input(s): BNP, PROBNP in the last 168 hours.   DDimer  Recent Labs  Lab 09/15/20 1010  DDIMER <0.27  Radiology    CT Head Wo Contrast  Result Date: 09/14/2020 CLINICAL DATA:  Syncope EXAM: CT HEAD WITHOUT CONTRAST TECHNIQUE: Contiguous axial images were obtained from the base of the skull through the vertex without intravenous contrast. COMPARISON:  None. FINDINGS: Brain: No evidence of acute infarction, hemorrhage, hydrocephalus, extra-axial collection or mass lesion/mass effect. Vascular: No hyperdense vessel or unexpected calcification. Skull: Normal. Negative for fracture or focal lesion. Sinuses/Orbits: No acute finding. Other: None. IMPRESSION: No acute intracranial pathology. Electronically Signed   By: Lauralyn PrimesAlex  Bibbey M.D.   On: 09/14/2020 15:26   DG Shoulder Left  Result Date: 09/14/2020 CLINICAL DATA:  Pain after fall. EXAM: LEFT SHOULDER - 2+ VIEW COMPARISON:  None. FINDINGS: There is no evidence of fracture or dislocation. There is no evidence of arthropathy or other focal bone abnormality. Soft tissues are unremarkable. IMPRESSION: Negative. Electronically Signed   By: Gerome Samavid  Williams III M.D   On:  09/14/2020 15:15   ECHOCARDIOGRAM COMPLETE  Result Date: 09/15/2020    ECHOCARDIOGRAM REPORT   Patient Name:   [name] Date of Exam: 09/15/2020 Medical Rec #:  [id number]     Height:       63.0 in Accession #:    [id number]    Weight:       135.4 lb Date of Birth:  03/21/2001    BSA:          1.638 m Patient Age:    19 years      BP:           97/61 mmHg Patient Gender: F             HR:           64 bpm. Exam Location:  Inpatient Procedure: 2D Echo, Cardiac Doppler and Color Doppler Indications:    R55 Syncope  History:        Patient has no prior history of Echocardiogram examinations.  Sonographer:    Elmarie Shileyiffany Dance Referring Phys: [id number] DAVID MANUEL ORTIZ IMPRESSIONS  1. Left ventricular ejection fraction, by estimation, is 55 to 60%. The left ventricle has normal function. The left ventricle has no regional wall motion abnormalities. Left ventricular diastolic parameters were normal.  2. Right ventricular systolic function is normal. The right ventricular size is normal.  3. The mitral valve is abnormal. Mild leaflet thickening without prolapse. Trivial mitral valve regurgitation.  4. The aortic valve is tricuspid. Aortic valve regurgitation is not visualized. Conclusion(s)/Recommendation(s): Otherwise normal echocardiogram, with minor abnormalities described in the report. FINDINGS  Left Ventricle: Left ventricular ejection fraction, by estimation, is 55 to 60%. The left ventricle has normal function. The left ventricle has no regional wall motion abnormalities. The left ventricular internal cavity size was normal in size. There is  no left ventricular hypertrophy. Left ventricular diastolic parameters were normal. Right Ventricle: The right ventricular size is normal. No increase in right ventricular wall thickness. Right ventricular systolic function is normal. Left Atrium: Left atrial size was normal in size. Right Atrium: Right atrial size was normal in size. Pericardium: There is no evidence  of pericardial effusion. Mitral Valve: The mitral valve is abnormal. There is mild thickening of the mitral valve leaflet(s). Trivial mitral valve regurgitation. Tricuspid Valve: The tricuspid valve is grossly normal. Tricuspid valve regurgitation is trivial. Aortic Valve: The aortic valve is tricuspid. Aortic valve regurgitation is not visualized. Pulmonic Valve: The pulmonic valve was grossly normal. Pulmonic valve regurgitation is trivial. Aorta: The aortic root and ascending aorta are  structurally normal, with no evidence of dilitation. IAS/Shunts: No atrial level shunt detected by color flow Doppler.  LEFT VENTRICLE PLAX 2D LVIDd:         3.60 cm LVIDs:         2.90 cm LV PW:         0.90 cm LV IVS:        0.60 cm LVOT diam:     2.00 cm LV SV:         47 LV SV Index:   29 LVOT Area:     3.14 cm  RIGHT VENTRICLE             IVC RV Basal diam:  2.50 cm     IVC diam: 1.30 cm RV S prime:     10.40 cm/s TAPSE (M-mode): 1.7 cm LEFT ATRIUM             Index       RIGHT ATRIUM           Index LA diam:        2.90 cm 1.77 cm/m  RA Area:     10.40 cm LA Vol (A2C):   58.2 ml 35.53 ml/m RA Volume:   20.20 ml  12.33 ml/m LA Vol (A4C):   33.9 ml 20.69 ml/m LA Biplane Vol: 45.1 ml 27.53 ml/m  AORTIC VALVE LVOT Vmax:   80.10 cm/s LVOT Vmean:  49.000 cm/s LVOT VTI:    0.151 m  AORTA Ao Root diam: 2.80 cm Ao Asc diam:  2.40 cm MITRAL VALVE MV Area (PHT): 3.60 cm    SHUNTS MV Decel Time: 211 msec    Systemic VTI:  0.15 m MV E velocity: 70.30 cm/s  Systemic Diam: 2.00 cm MV A velocity: 37.70 cm/s MV E/A ratio:  1.86 Zoila Shutter MD Electronically signed by Zoila Shutter MD Signature Date/Time: 09/15/2020/1:16:41 PM    Final     Cardiac Studies   Echocardiogram 09/15/2020: Impressions: 1. Left ventricular ejection fraction, by estimation, is 55 to 60%. The  left ventricle has normal function. The left ventricle has no regional  wall motion abnormalities. Left ventricular diastolic parameters were  normal.  2.  Right ventricular systolic function is normal. The right ventricular  size is normal.  3. The mitral valve is abnormal. Mild leaflet thickening without  prolapse. Trivial mitral valve regurgitation.  4. The aortic valve is tricuspid. Aortic valve regurgitation is not  visualized.   Conclusion(s)/Recommendation(s): Otherwise normal echocardiogram, with  minor abnormalities described in the report.   Patient Profile     19 y.o. female with no significant past medical history who is being seen today for evaluation of syncope at the request of Dr. Janee Morn.  Assessment & Plan    Syncope with Collapse - Patient admitted for syncope after showering. She fell and hit her chin and fractured 2 molars on right side. Only prodromal symptoms was lightheadedness. No other cardiac symptoms.  - EKG showed no acute findings. - Echo showed LVEF of 55-60% with normal wall motion, normal diastolic parameters, and trivial MR. - Telemetry show no concerning arrhythmias to explain syncope. No high grade AV blocks or concerning tachyarrhythmias.  - D-dimer negative. - Orthostatics negative yesterday. Systolic BP in the 90's lying down but BP actually increased when going from lying to standing. - Syncope possible due to reflex syncope (vasovagal after hot shower vs orthostasis) or hypoglycemia. Less likely arrhythmogenic as EKG is normal, no family or personal history of CV  disease, and no history of palpitations. - Will discuss with MD about whether outpatient monitor is needed.  Hypoglycemia - Required Dextrose. - Hemoglobin A1c pending. - Possible cause of syncope.  Hypokalemia - Potassium 3.3 this morning. - Supplementation ordered by primary team.  UTI - Patient being treated with Rocephin.  For questions or updates, please contact [facility] Please consult [email or web address] for contact info under   Patient seen and examined and agree with Marjie Skiff, PA as detailed above.  In brief,  the patient is a 19 year old female with no significant PMH who presented with syncope with resultant chin laceration and fracture of right sided molars. Clinically stable with reassuring work-up. Orthostatics negative. TTE with normal BiV function, no significant valvular abnormalities. Telemetry with no arrhythmias or significant ectopy. Patient is okay to discharge home with plans for long-term monitor after discharge (will be arranged for the patient) and planned Cardiology follow-up.  Exam: General: comfortable, NAD HEENT: skin laceration on chin c/d/i CV: RR, no murmurs Resp: CTAB Abdomen: Soft, ND, +BS Extremities: Warm, no edema Neuro: Nonfocal  Plan: -Okay to discharge home from CV standpoint -Arranged for cardiac monitor to be mailed to the patient for continued surveillance post-discharge -Counseled about countermeasures to prevent fainting if feeling lightheaded. Specifically instructed her to lay down with her feet as well as the need to maintain adequate hydration and PO intake -Follow-up in Cardiology clinic with me after monitor completed; will arrange for her  Laurance Flatten, MD       Signed, Corrin Parker, PA-C  09/16/2020, 8:08 AM

## 2020-09-17 ENCOUNTER — Telehealth: Payer: Self-pay | Admitting: *Deleted

## 2020-09-17 NOTE — Telephone Encounter (Signed)
Patient enrolled for [employer] to ship a 30 day cardiac event monitor to her home.  Instructions briefly reviewed with patient as they are included in the monitor kit.

## 2020-10-19 NOTE — Progress Notes (Signed)
Cardiology Office Note:    Date:  10/21/2020   ID:  Pamela Bryant, DOB Feb 28, 2001, MRN [id number]  PCP:  Pcp, No  [facility] Cardiologist:  Meriam Sprague, MD  [facility] Electrophysiologist:  None   Referring MD: No ref. provider found     History of Present Illness:    Pamela Bryant is a 19 y.o. female with no significant PMH who was recently admitted to [facility] hospital for syncope with head strike now presenting for follow-up from recent hospitalization.  During the hospitalization back in October, the patient presented after syncopal episode following a shower in the morning. She hit her chin and fractured 2 molars on her right side. Denied any other prodromal symptoms like dyspnea, chest pain/pressure, diaphoresis, nausea or palpitations. No recent illnesses, fevers or chills. No abdominal symptoms. Ate normally the night before and had just woken up at that time. Denied alcohol, illicit drug use, herbal supplements.  In the ED,  BP 90/50s. HR 60-70s. Glucose was low at 57 and 61. Electrolytes within normal limits. CT head without pathology. ECG with sinus rhythm with sinus arrhythmia. No ischemia. No block. TTE with normal BiV function and no significant valvular abnormalities. Orthostatics negative but were checked after IVF given.  Denies any family history of known cardiovascular disease. No personal of family history of arrhythmias. No SCD or loss of a family member at a young age. She has no history of prior syncope. No lightheadedness with prolonged standing or other vagal symptoms.  She presents for follow-up today. She states that she feels overall well. Has intermittent tooth aches at the site where she fractured the molars. No lightheadedness, chest pain, SOB, or further episodes of syncope. She has returned to school without issues. Patient has been walking without issues. She has the monitor at home and plans to wear it when she gets back.  Past Medical  History: Episode of syncope with collapse as detailed above Otherwise healthy  Current Medications: No outpatient medications have been marked as taking for the 10/21/20 encounter (Office Visit) with Meriam Sprague, MD.     Allergies:   Patient has no known allergies.   Social History   Socioeconomic History  . Marital status: Significant Other    Spouse name: Not on file  . Number of children: Not on file  . Years of education: Not on file  . Highest education level: Not on file  Occupational History  . Not on file  Tobacco Use  . Smoking status: Never Smoker  . Smokeless tobacco: Never Used  Vaping Use  . Vaping Use: Never used  Substance and Sexual Activity  . Alcohol use: Never  . Drug use: Never  . Sexual activity: Not on file  Other Topics Concern  . Not on file  Social History Narrative  . Not on file   Social Determinants of Health   Financial Resource Strain:   . Difficulty of Paying Living Expenses: Not on file  Food Insecurity:   . Worried About Programme researcher, broadcasting/film/video in the Last Year: Not on file  . Ran Out of Food in the Last Year: Not on file  Transportation Needs:   . Lack of Transportation (Medical): Not on file  . Lack of Transportation (Non-Medical): Not on file  Physical Activity:   . Days of Exercise per Week: Not on file  . Minutes of Exercise per Session: Not on file  Stress:   . Feeling of Stress : Not on file  Social Connections:   . Frequency of Communication with Friends and Family: Not on file  . Frequency of Social Gatherings with Friends and Family: Not on file  . Attends Religious Services: Not on file  . Active Member of Clubs or Organizations: Not on file  . Attends BankerClub or Organization Meetings: Not on file  . Marital Status: Not on file     Family History: The patient's family history includes Healthy in her father and mother.  No family history of CAD, SCD, arrthymias or syncope  ROS:   Please see the history of  present illness.    Review of Systems  Constitutional: Negative for chills and fever.  HENT: Negative for hearing loss.   Eyes: Negative for blurred vision.  Respiratory: Negative for shortness of breath.   Cardiovascular: Negative for chest pain, palpitations, orthopnea, claudication, leg swelling and PND.  Gastrointestinal: Negative for heartburn, nausea and vomiting.  Genitourinary: Negative for hematuria.  Musculoskeletal: Positive for myalgias.  Neurological: Negative for dizziness and loss of consciousness.  Endo/Heme/Allergies: Negative for polydipsia.  Psychiatric/Behavioral: Negative for substance abuse. The patient is not nervous/anxious.    Had prior episode of syncope as detailed above but no episodes since that time.  EKGs/Labs/Other Studies Reviewed:    The following studies were reviewed today: TTE 09/15/20: 1. Left ventricular ejection fraction, by estimation, is 55 to 60%. The  left ventricle has normal function. The left ventricle has no regional  wall motion abnormalities. Left ventricular diastolic parameters were  normal.  2. Right ventricular systolic function is normal. The right ventricular  size is normal.  3. The mitral valve is abnormal. Mild leaflet thickening without  prolapse. Trivial mitral valve regurgitation.  4. The aortic valve is tricuspid. Aortic valve regurgitation is not  visualized.   Conclusion(s)/Recommendation(s): Otherwise normal echocardiogram, with  minor abnormalities described in the report.   FINDINGS  Left Ventricle: Left ventricular ejection fraction, by estimation, is 55  to 60%. The left ventricle has normal function. The left ventricle has no  regional wall motion abnormalities. The left ventricular internal cavity  size was normal in size. There is  no left ventricular hypertrophy. Left ventricular diastolic parameters  were normal.   Right Ventricle: The right ventricular size is normal. No increase in  right  ventricular wall thickness. Right ventricular systolic function is  normal.   Left Atrium: Left atrial size was normal in size.   Right Atrium: Right atrial size was normal in size.   Pericardium: There is no evidence of pericardial effusion.   Mitral Valve: The mitral valve is abnormal. There is mild thickening of  the mitral valve leaflet(s). Trivial mitral valve regurgitation.   Tricuspid Valve: The tricuspid valve is grossly normal. Tricuspid valve  regurgitation is trivial.   Aortic Valve: The aortic valve is tricuspid. Aortic valve regurgitation is  not visualized.   Pulmonic Valve: The pulmonic valve was grossly normal. Pulmonic valve  regurgitation is trivial.   Aorta: The aortic root and ascending aorta are structurally normal, with  no evidence of dilitation.   IAS/Shunts: No atrial level shunt detected by color flow Doppler.   ECG 09/14/20: Sinus rhythm with sinus arrhythmia. Non-specific ST-T wave changes. No ischemia. No block.  EKG:  EKG is  ordered today.  The ekg ordered today demonstrates NSR with sinus arrhythmia.  Recent Labs: 09/14/2020: Hemoglobin 12.3; Platelets 235 09/15/2020: ALT 15 09/16/2020: BUN 9; Creatinine, Ser 0.75; Magnesium 2.1; Potassium 3.3; Sodium 139  Recent Lipid Panel No  results found for: CHOL, TRIG, HDL, CHOLHDL, VLDL, LDLCALC, LDLDIRECT    Physical Exam:    VS:  BP 92/74   Pulse 66   Ht 5\' 4"  (1.626 m)   Wt 136 lb (61.7 kg)   BMI 23.34 kg/m     Wt Readings from Last 3 Encounters:  10/21/20 136 lb (61.7 kg) (63 %, Z= 0.34)*  09/14/20 135 lb 6.4 oz (61.4 kg) (62 %, Z= 0.32)*   * Growth percentiles are based on CDC (Girls, 2-20 Years) data.     GEN:  Well nourished, well developed in no acute distress HEENT: Normal NECK: No JVD; No carotid bruits CARDIAC: RRR, no murmurs, rubs, gallops RESPIRATORY:  Clear to auscultation without rales, wheezing or rhonchi  ABDOMEN: Soft, non-tender, non-distended MUSCULOSKELETAL:  No  edema; No deformity  SKIN: Warm and dry NEUROLOGIC:  Alert and oriented x 3 PSYCHIATRIC:  Normal affect   ASSESSMENT:    1. Syncope, unspecified syncope type    PLAN:    In order of problems listed above:  #Syncope with Collapse: Patient with episode of syncope after showering on 09/15/20. Fell and hit her chin and fractured 2 molars on her right side. Only prodromal symptom was lightheadedness. No nausea, palpitations, chest pain or SOB. No alcohol or drug use. Ate normally the day before. No personal history of cardiac disease or prior syncopal spells. No family history of CV disease, arrhythmias, SCD. The patient otherwise was feeling well with no fevers, chills or recent illnesses.  Orthostatics negative (but checked after IVF). A1C normal.  ECG without block or ischemia. TTE with normal BiV function, no significant valvular abnormalities. Suspect possible reflex syncope (orthostasis vs vasovagal after hot shower). Cardiac monitor ordered on discharge, but patient just received it and did not wear it yet. -Patient has received the zio patch, asked her to put it on and wear it for 14days -TTE with normal BiV function and no significant arrhythmias -Counseled about orthostatic symptoms as detailed below -Follow-up with me as needed pending monitor results  Orthostatic precautions: -Avoid dehydration. Often it requires high volumes of fluids, often with salt/electrolytes included, to stay hydrated. People with orthostasis are very sensitive to fluid shifts and dehydration. Oral rehydration is preferred, and routine use of IV fluids is not recommended. -If tolerated, compression stocking can assist with fluid management and prevent pooling in the legs. -Slow position changes are recommended -If there is a feeling of severe lightheadedness, like near to passing out, recommend lying on the floor on the back, with legs elevated up on a chair or up against the wall.   Medication  Adjustments/Labs and Tests Ordered: Current medicines are reviewed at length with the patient today.  Concerns regarding medicines are outlined above.  Orders Placed This Encounter  Procedures  . EKG 12-Lead   No orders of the defined types were placed in this encounter.   Patient Instructions  Medication Instructions:  *If you need a refill on your cardiac medications before your next appointment, please call your pharmacy*  Lab Work: If you have labs (blood work) drawn today and your tests are completely normal, you will receive your results only by: 09/17/20 MyChart Message (if you have MyChart) OR . A paper copy in the mail If you have any lab test that is abnormal or we need to change your treatment, we will call you to review the results.  Testing/Procedures: Your physician has recommended that you wear a 14 DAY ZIO holter monitor. Holter monitors  are medical devices that record the heart's electrical activity. Doctors most often use these monitors to diagnose arrhythmias. Arrhythmias are problems with the speed or rhythm of the heartbeat. The monitor is a small, portable device. You can wear one while you do your normal daily activities. This is usually used to diagnose what is causing palpitations/syncope (passing out). -- you already have this at home   Follow-Up: At [facility], you and your health needs are our priority.  As part of our continuing mission to provide you with exceptional heart care, we have created designated Provider Care Teams.  These Care Teams include your primary Cardiologist (physician) and Advanced Practice Providers (APPs -  Physician Assistants and Nurse Practitioners) who all work together to provide you with the care you need, when you need it.  We recommend signing up for the patient portal called "MyChart".  Sign up information is provided on this After Visit Summary.  MyChart is used to connect with patients for Virtual Visits (Telemedicine).  Patients  are able to view lab/test results, encounter notes, upcoming appointments, etc.  Non-urgent messages can be sent to your provider as well.   To learn more about what you can do with MyChart, go to [email or web address].    Your next appointment:   Your physician recommends that you schedule a follow-up appointment as needed with Dr. Shari Prows   The format for your next appointment:   In Person with You may see Meriam Sprague, MD or one of the following Advanced Practice Providers on your designated Care Team:    Tereso Newcomer, PA-C  Chelsea Aus, [place]     Signed, Meriam Sprague, MD  10/21/2020 11:06 AM    [facility]

## 2020-10-21 ENCOUNTER — Ambulatory Visit (INDEPENDENT_AMBULATORY_CARE_PROVIDER_SITE_OTHER): Payer: Medicaid Other

## 2020-10-21 ENCOUNTER — Ambulatory Visit (INDEPENDENT_AMBULATORY_CARE_PROVIDER_SITE_OTHER): Payer: Medicaid Other | Admitting: Cardiology

## 2020-10-21 ENCOUNTER — Other Ambulatory Visit: Payer: Self-pay

## 2020-10-21 ENCOUNTER — Encounter: Payer: Self-pay | Admitting: Cardiology

## 2020-10-21 VITALS — BP 92/74 | HR 66 | Ht 64.0 in | Wt 136.0 lb

## 2020-10-21 DIAGNOSIS — R55 Syncope and collapse: Secondary | ICD-10-CM

## 2020-10-21 NOTE — Patient Instructions (Signed)
Medication Instructions:  *If you need a refill on your cardiac medications before your next appointment, please call your pharmacy*  Lab Work: If you have labs (blood work) drawn today and your tests are completely normal, you will receive your results only by: Marland Kitchen MyChart Message (if you have MyChart) OR . A paper copy in the mail If you have any lab test that is abnormal or we need to change your treatment, we will call you to review the results.  Testing/Procedures: Your physician has recommended that you wear a 14 DAY ZIO holter monitor. Holter monitors are medical devices that record the heart's electrical activity. Doctors most often use these monitors to diagnose arrhythmias. Arrhythmias are problems with the speed or rhythm of the heartbeat. The monitor is a small, portable device. You can wear one while you do your normal daily activities. This is usually used to diagnose what is causing palpitations/syncope (passing out). -- you already have this at home   Follow-Up: At [facility], you and your health needs are our priority.  As part of our continuing mission to provide you with exceptional heart care, we have created designated Provider Care Teams.  These Care Teams include your primary Cardiologist (physician) and Advanced Practice Providers (APPs -  Physician Assistants and Nurse Practitioners) who all work together to provide you with the care you need, when you need it.  We recommend signing up for the patient portal called "MyChart".  Sign up information is provided on this After Visit Summary.  MyChart is used to connect with patients for Virtual Visits (Telemedicine).  Patients are able to view lab/test results, encounter notes, upcoming appointments, etc.  Non-urgent messages can be sent to your provider as well.   To learn more about what you can do with MyChart, go to [email or web address].    Your next appointment:   Your physician recommends that you schedule a  follow-up appointment as needed with Dr. Shari Prows   The format for your next appointment:   In Person with You may see Meriam Sprague, MD or one of the following Advanced Practice Providers on your designated Care Team:    Tereso Newcomer, PA-C  [place], [place]

## 2021-12-15 IMAGING — CT CT HEAD W/O CM
3 series · 15 of 47 positions shown, 18 images · non-contrast
Comparison: None.

CLINICAL DATA: Syncope

EXAM:
CT HEAD WITHOUT CONTRAST
TECHNIQUE: Contiguous axial images were obtained from the base of the skull
through the vertex without intravenous contrast.

[Series 2: head wo · axial · 0.47mm/px · z∈[-207,-82]mm · 9 of 30 slices shown, 12 images]
[im 3/30  brain]
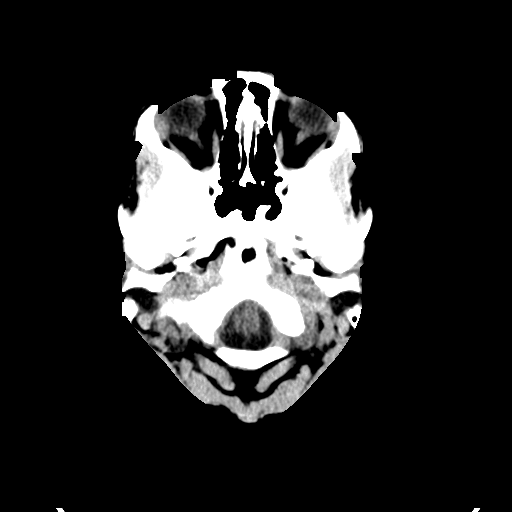
[im 3/30  bone]
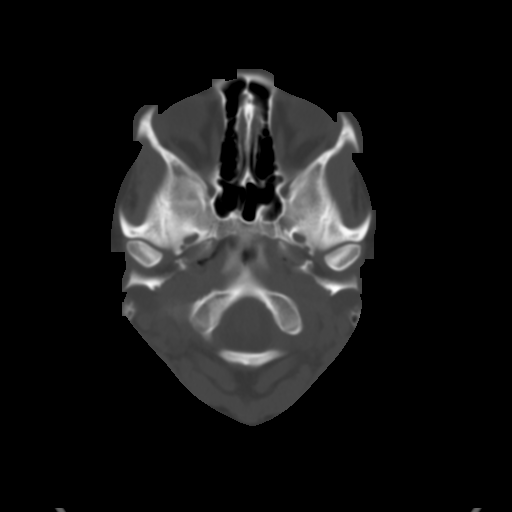
[im 6/30  brain]
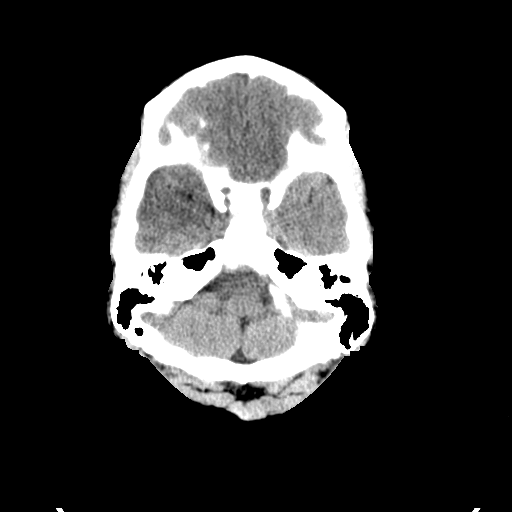
[im 9/30  brain]
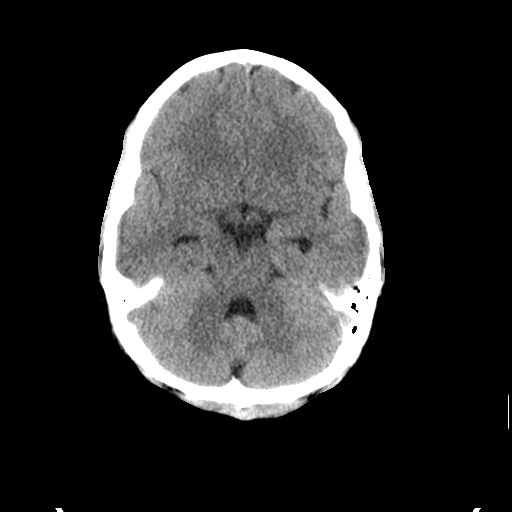
[im 12/30  brain]
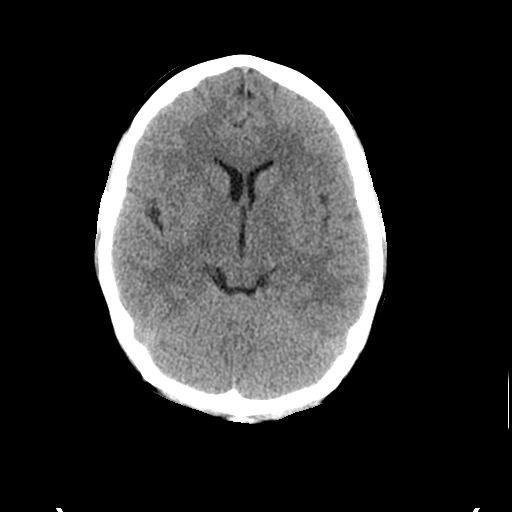
[im 16/30  brain]
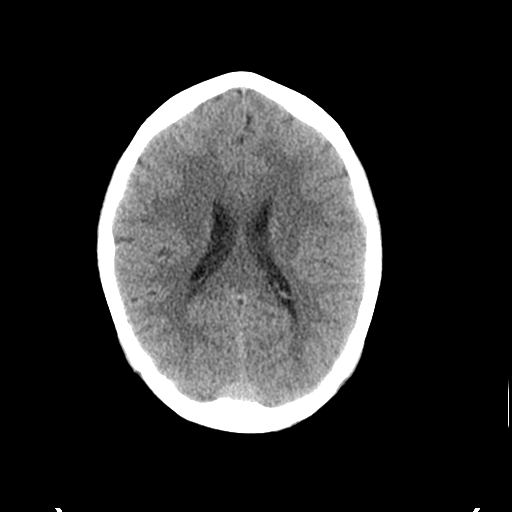
[im 16/30  bone]
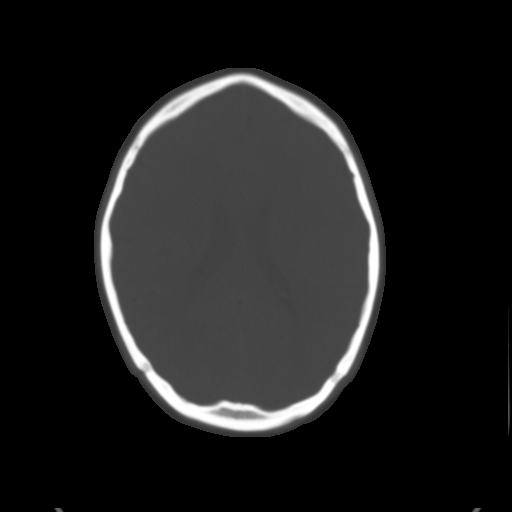
[im 19/30  brain]
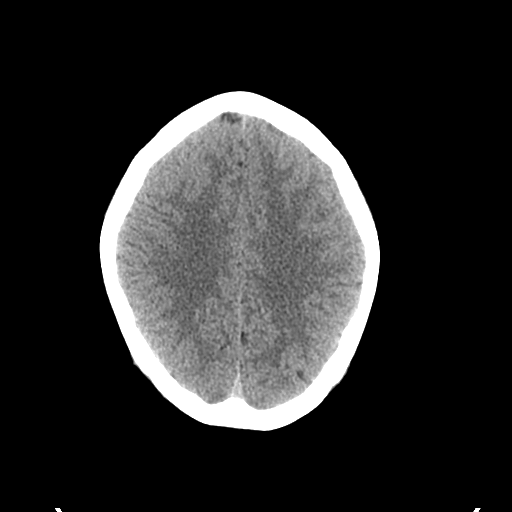
[im 22/30  brain]
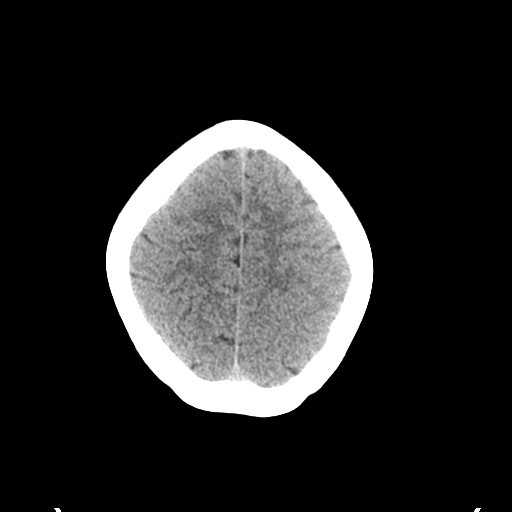
[im 25/30  brain]
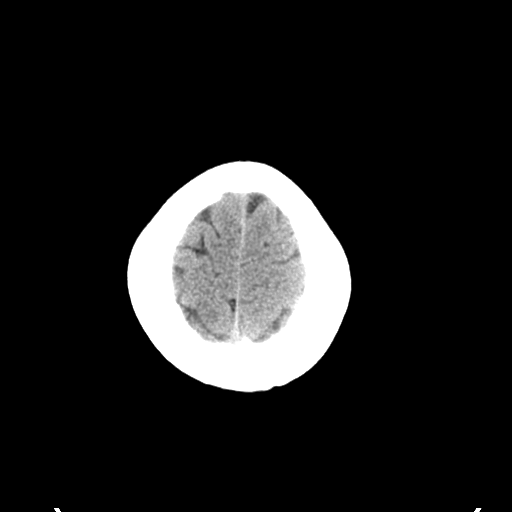
[im 28/30  brain]
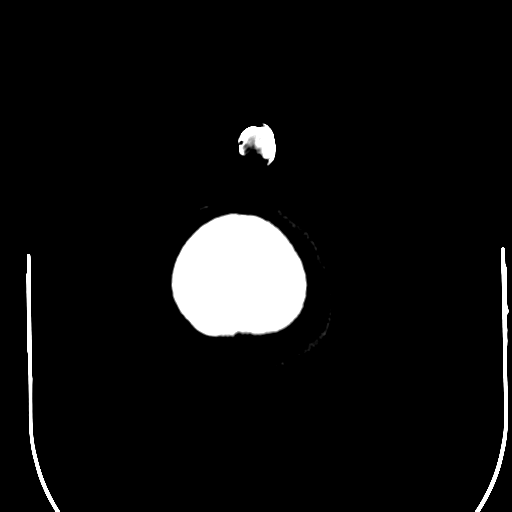
[im 28/30  bone]
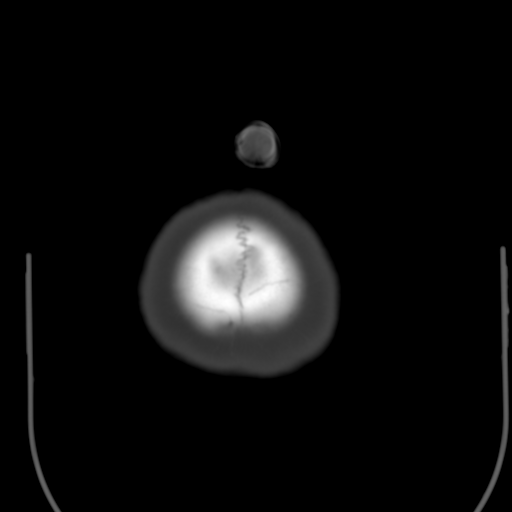

[Series 5: coronal soft tissue · coronal · 0.29mm/px · 3 of 64 slices shown]
[im 22/64  brain]
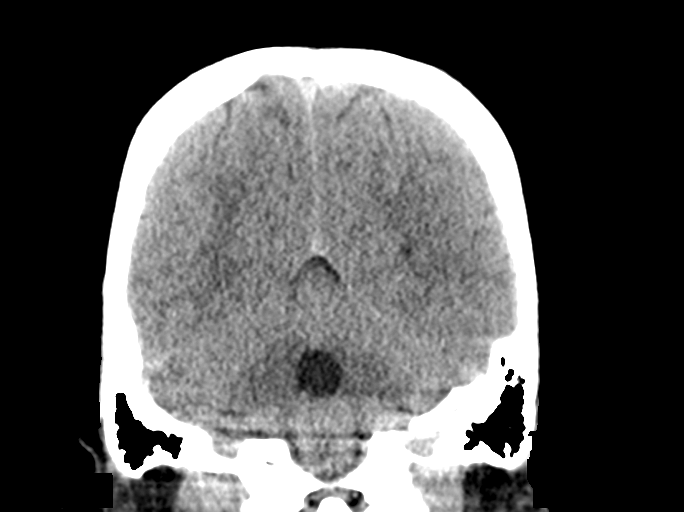
[im 29/64  brain]
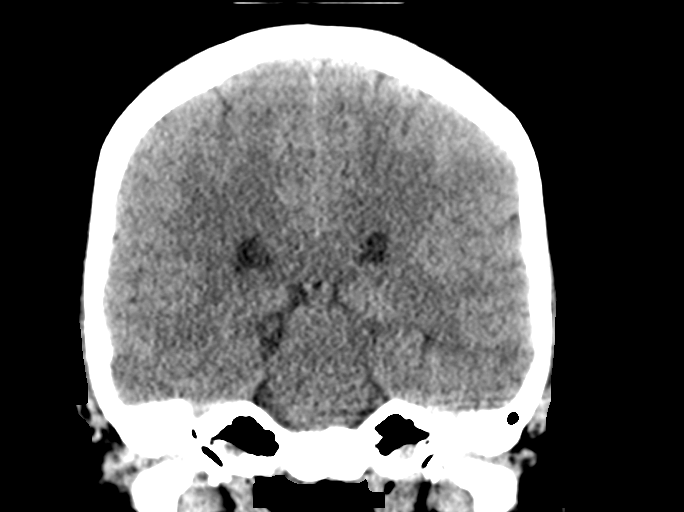
[im 36/64  brain]
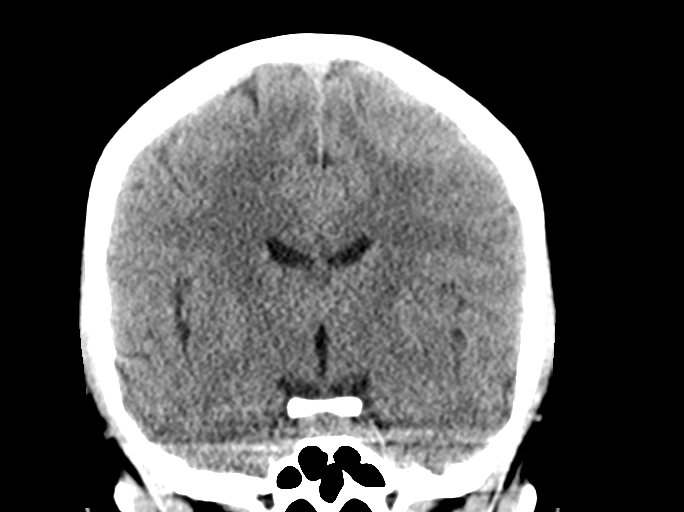

[Series 6: sagittal soft tissue · sagittal · 0.29mm/px · 3 of 66 slices shown]
[im 22/66  brain]
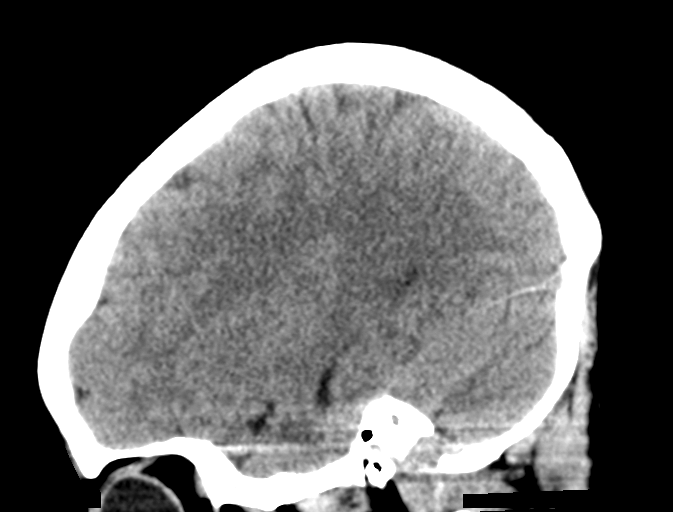
[im 33/66  brain]
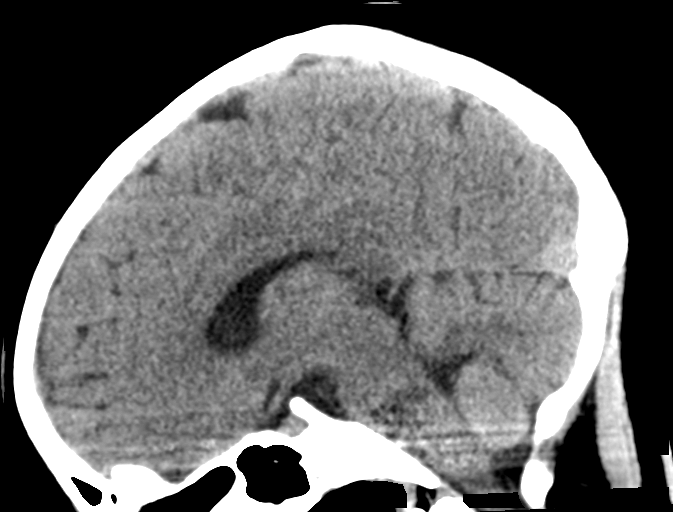
[im 44/66  brain]
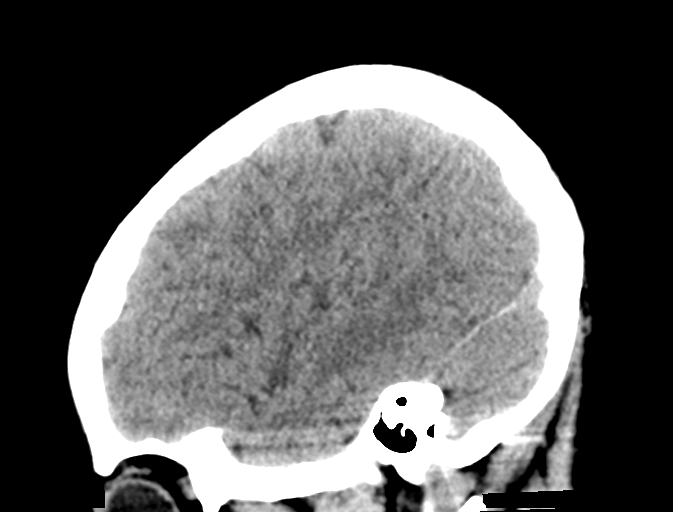

[15 of 47 positions shown; findings below may reference images not displayed]

FINDINGS: Brain: No evidence of acute infarction, hemorrhage, hydrocephalus,
extra-axial collection or mass lesion/mass effect.

Vascular: No hyperdense vessel or unexpected calcification.

Skull: Normal. Negative for fracture or focal lesion.

Sinuses/Orbits: No acute finding.

Other: None.
IMPRESSION: No acute intracranial pathology.
